# Patient Record
Sex: Female | Born: 1995 | Race: Black or African American | Hispanic: No | Smoking: Former smoker
Health system: Southern US, Community
[De-identification: ages and names within clinical notes are randomized; demographics above are authoritative.]

## PROBLEM LIST (undated history)

## (undated) DIAGNOSIS — O039 Complete or unspecified spontaneous abortion without complication: Secondary | ICD-10-CM

## (undated) DIAGNOSIS — R51 Headache: Secondary | ICD-10-CM

## (undated) HISTORY — PX: NO PAST SURGERIES: SHX2092

## (undated) HISTORY — DX: Headache: R51

---
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
## 2019-11-09 ENCOUNTER — Other Ambulatory Visit: Payer: Self-pay

## 2019-11-09 ENCOUNTER — Ambulatory Visit (INDEPENDENT_AMBULATORY_CARE_PROVIDER_SITE_OTHER): Payer: Medicaid Other | Admitting: Student

## 2019-11-09 VITALS — BP 134/90 | HR 68 | Ht 60.5 in | Wt 105.2 lb

## 2019-11-09 DIAGNOSIS — Z3201 Encounter for pregnancy test, result positive: Secondary | ICD-10-CM | POA: Diagnosis not present

## 2019-11-09 DIAGNOSIS — Z3200 Encounter for pregnancy test, result unknown: Secondary | ICD-10-CM

## 2019-11-09 LAB — POCT PREGNANCY, URINE: Preg Test, Ur: POSITIVE — AB

## 2019-11-09 NOTE — Progress Notes (Signed)
Patient ID: Mariah Sosa, female   DOB: 07-26-1995, 24 y.o.   MRN: [id number]    History:  Mariah Sosa is a 24 y.o. No obstetric history on file. who presents to clinic today with complaint of possible pregnancy.    Past Medical History:  Diagnosis Date  . Headache(784.0)     No past surgical history on file.  The following portions of the patient's history were reviewed and updated as appropriate: allergies, current medications, past family history, past medical history, past social history, past surgical history and problem list.   Review of Systems:  Pertinent items noted in HPI and remainder of comprehensive ROS otherwise negative.  Objective:  Physical Exam BP 134/90   Pulse 68   Ht 5' 0.5" (1.537 m)   Wt 105 lb 3.2 oz (47.7 kg)   SpO2 100%   BMI 20.21 kg/m  Physical Exam Constitutional:      Appearance: Normal appearance.  Musculoskeletal:        General: Normal range of motion.  Skin:    General: Skin is warm.  Neurological:     Mental Status: She is alert.      Labs and Imaging Results for orders placed or performed in visit on 11/09/19 (from the past 24 hour(s))  Pregnancy, urine POC     Status: Abnormal   Collection Time: 11/09/19 10:14 AM  Result Value Ref Range   Preg Test, Ur POSITIVE (A) NEGATIVE    No results found.   Assessment & Plan:  1. Possible pregnancy -Patient doing well, advised patient to schedule NOB intake at 10 weeks.  -Patient and partner had no questions at this time.     Approximately 10 minutes of total time was spent with this patient. Over 50% of encounter was spent on counseling and coordination of care.  Mariah Sosa, CNM 11/09/2019 11:24 AM

## 2019-11-09 NOTE — Progress Notes (Signed)
Pt here today for possible pregnancy. First positive home UPT on 11/06/19. UPT today is positive. Results given. Dating reviewed with pt. Reports LMP of 10/06/19, pt is 4w 6d today; EDD is 07/12/20. Pt denies any pain or bleeding. Reports occasional mild cramping. Good ectopic/miscarriage precations reviewed. Reviewed medications and allergies; list of medications safe to take during pregnancy given. Pt would like to be scheduled for new OB appts. Plans to apply for pregnancy medicaid as soon as she is able. No coverage at this time.   Fleet Contras RN 11/09/19

## 2019-11-15 ENCOUNTER — Encounter: Payer: Self-pay | Admitting: Student

## 2019-12-18 ENCOUNTER — Telehealth: Payer: Self-pay | Admitting: Family Medicine

## 2019-12-18 ENCOUNTER — Encounter (HOSPITAL_COMMUNITY): Payer: Self-pay | Admitting: Obstetrics and Gynecology

## 2019-12-18 ENCOUNTER — Other Ambulatory Visit: Payer: Self-pay

## 2019-12-18 ENCOUNTER — Inpatient Hospital Stay (HOSPITAL_COMMUNITY): Payer: BC Managed Care – PPO

## 2019-12-18 ENCOUNTER — Inpatient Hospital Stay (HOSPITAL_COMMUNITY)
Admission: AD | Admit: 2019-12-18 | Discharge: 2019-12-18 | Disposition: A | Discharge status: Home / Self Care | Payer: BC Managed Care – PPO | Attending: Obstetrics and Gynecology | Admitting: Obstetrics and Gynecology

## 2019-12-18 DIAGNOSIS — O4691 Antepartum hemorrhage, unspecified, first trimester: Secondary | ICD-10-CM

## 2019-12-18 DIAGNOSIS — A5901 Trichomonal vulvovaginitis: Secondary | ICD-10-CM | POA: Diagnosis not present

## 2019-12-18 DIAGNOSIS — O3680X0 Pregnancy with inconclusive fetal viability, not applicable or unspecified: Secondary | ICD-10-CM | POA: Insufficient documentation

## 2019-12-18 DIAGNOSIS — O98311 Other infections with a predominantly sexual mode of transmission complicating pregnancy, first trimester: Secondary | ICD-10-CM | POA: Diagnosis not present

## 2019-12-18 DIAGNOSIS — Z6790 Unspecified blood type, Rh positive: Secondary | ICD-10-CM | POA: Insufficient documentation

## 2019-12-18 DIAGNOSIS — Z3A09 9 weeks gestation of pregnancy: Secondary | ICD-10-CM | POA: Insufficient documentation

## 2019-12-18 DIAGNOSIS — O209 Hemorrhage in early pregnancy, unspecified: Secondary | ICD-10-CM | POA: Diagnosis present

## 2019-12-18 DIAGNOSIS — A599 Trichomoniasis, unspecified: Secondary | ICD-10-CM

## 2019-12-18 DIAGNOSIS — O4690 Antepartum hemorrhage, unspecified, unspecified trimester: Secondary | ICD-10-CM

## 2019-12-18 HISTORY — DX: Trichomoniasis, unspecified: A59.9

## 2019-12-18 LAB — URINALYSIS, ROUTINE W REFLEX MICROSCOPIC

## 2019-12-18 LAB — WET PREP, GENITAL
Clue Cells Wet Prep HPF POC: NONE SEEN
Sperm: NONE SEEN
Yeast Wet Prep HPF POC: NONE SEEN

## 2019-12-18 LAB — ABO/RH: ABO/RH(D): A POS

## 2019-12-18 LAB — CBC
HCT: 38.8 % (ref 36.0–46.0)
Hemoglobin: 13.4 g/dL (ref 12.0–15.0)
MCH: 32.2 pg (ref 26.0–34.0)
MCHC: 34.5 g/dL (ref 30.0–36.0)
MCV: 93.3 fL (ref 80.0–100.0)
Platelets: 226 10*3/uL (ref 150–400)
RBC: 4.16 MIL/uL (ref 3.87–5.11)
RDW: 11.8 % (ref 11.5–15.5)
WBC: 9.1 10*3/uL (ref 4.0–10.5)
nRBC: 0 % (ref 0.0–0.2)

## 2019-12-18 LAB — URINALYSIS, MICROSCOPIC (REFLEX): RBC / HPF: >50 RBC/hpf (ref 0–5)

## 2019-12-18 LAB — HCG, QUANTITATIVE, PREGNANCY: hCG, Beta Chain, Quant, S: 39068 m[IU]/mL — ABNORMAL HIGH (ref <5)

## 2019-12-18 MED ORDER — METRONIDAZOLE 500 MG PO TABS
2000.0000 mg | ORAL_TABLET | Freq: Once | ORAL | Status: AC
  Administered 2019-12-18: 2000 mg via ORAL
  Filled 2019-12-18: qty 4

## 2019-12-18 NOTE — MAU Note (Signed)
Patient reports to MAU with vaginal bleeding that started on 12/15/19 and has continued since. Patient states she soaked 2 pads last night with some clots.  Reports cramping that feels like a period.

## 2019-12-18 NOTE — MAU Provider Note (Signed)
History     CSN: [id number]  Arrival date and time: 12/18/19 1007   First Provider Initiated Contact with Patient 12/18/19 1109      Chief Complaint  Patient presents with   Vaginal Bleeding   24 y.o. G1 @[redacted]w[redacted]d  by LMP presenting with VB. Reports onset 3 days ago. Reports passing plum sized clots yesterday. She used 3 pads during the night. No recent IC. Endorses intermittent LAP. Rates 7/10.    OB History    Gravida  1   Para      Term      Preterm      AB      Living        SAB      TAB      Ectopic      Multiple      Live Births              Past Medical History:  Diagnosis Date   Headache(784.0)    Trichimoniasis 12/18/2019    Past Surgical History:  Procedure Laterality Date   NO PAST SURGERIES      Family History  Problem Relation Age of Onset   Heart failure Paternal Grandmother     Social History   Tobacco Use   Smoking status: Never Smoker   Smokeless tobacco: Never Used  Substance Use Topics   Alcohol use: No   Drug use: No    Allergies: No Known Allergies  No medications prior to admission.    Review of Systems  Constitutional: Negative for chills and fever.  Gastrointestinal: Positive for abdominal pain.  Genitourinary: Positive for vaginal bleeding.   Physical Exam   Blood pressure 132/88, pulse 91, temperature 99.3 F (37.4 C), temperature source Oral, resp. rate 16, weight 50.7 kg, last menstrual period 10/13/2019, SpO2 100 %.  Physical Exam Vitals and nursing note reviewed. Exam conducted with a chaperone present.  Constitutional:      General: She is not in acute distress.    Appearance: Normal appearance.  HENT:     Head: Normocephalic and atraumatic.  Cardiovascular:     Rate and Rhythm: Normal rate.  Pulmonary:     Effort: Pulmonary effort is normal. No respiratory distress.  Abdominal:     General: There is no distension.     Palpations: Abdomen is soft. There is no mass.     Tenderness:  There is no abdominal tenderness. There is no guarding or rebound.  Genitourinary:    Comments: External: no lesions or erythema Vagina: rugated, pink, moist, scant bloody discharge Uterus: non enlarged, anteverted, non tender, no CMT Adnexae: no masses, no tenderness left, no tenderness right Cervix closed  Musculoskeletal:        General: Normal range of motion.     Cervical back: Normal range of motion.  Skin:    General: Skin is warm and dry.  Neurological:     General: No focal deficit present.     Mental Status: She is alert and oriented to person, place, and time.  Psychiatric:        Mood and Affect: Mood normal.        Behavior: Behavior normal.    Results for orders placed or performed during the hospital encounter of 12/18/19 (from the past 24 hour(s))  ABO/Rh     Status: None   Collection Time: 12/18/19 10:31 AM  Result Value Ref Range   ABO/RH(D) A POS    No rh immune globuloin  NOT A RH IMMUNE GLOBULIN CANDIDATE, PT RH POSITIVE Performed at [facility], [address], [place], [place] [address]   CBC     Status: None   Collection Time: 12/18/19 10:31 AM  Result Value Ref Range   WBC 9.1 4.0 - 10.5 K/uL   RBC 4.16 3.87 - 5.11 MIL/uL   Hemoglobin 13.4 12.0 - 15.0 g/dL   HCT 46.6 36 - 46 %   MCV 93.3 80.0 - 100.0 fL   MCH 32.2 26.0 - 34.0 pg   MCHC 34.5 30.0 - 36.0 g/dL   RDW 59.9 35.7 - 01.7 %   Platelets 226 150 - 400 K/uL   nRBC 0.0 0.0 - 0.2 %  hCG, quantitative, pregnancy     Status: Abnormal   Collection Time: 12/18/19 10:31 AM  Result Value Ref Range   hCG, Beta Chain, Quant, S 39,068 (H) <5 mIU/mL  Urinalysis, Routine w reflex microscopic Urine, Clean Catch     Status: Abnormal   Collection Time: 12/18/19 10:49 AM  Result Value Ref Range   Color, Urine RED (A) YELLOW   APPearance TURBID (A) CLEAR   Specific Gravity, Urine  1.005 - 1.030    TEST NOT REPORTED DUE TO COLOR INTERFERENCE OF URINE PIGMENT   pH  5.0 - 8.0    TEST NOT  REPORTED DUE TO COLOR INTERFERENCE OF URINE PIGMENT   Glucose, UA (A) NEGATIVE mg/dL    TEST NOT REPORTED DUE TO COLOR INTERFERENCE OF URINE PIGMENT   Hgb urine dipstick (A) NEGATIVE    TEST NOT REPORTED DUE TO COLOR INTERFERENCE OF URINE PIGMENT   Bilirubin Urine (A) NEGATIVE    TEST NOT REPORTED DUE TO COLOR INTERFERENCE OF URINE PIGMENT   Ketones, ur (A) NEGATIVE mg/dL    TEST NOT REPORTED DUE TO COLOR INTERFERENCE OF URINE PIGMENT   Protein, ur (A) NEGATIVE mg/dL    TEST NOT REPORTED DUE TO COLOR INTERFERENCE OF URINE PIGMENT   Nitrite (A) NEGATIVE    TEST NOT REPORTED DUE TO COLOR INTERFERENCE OF URINE PIGMENT   Leukocytes,Ua (A) NEGATIVE    TEST NOT REPORTED DUE TO COLOR INTERFERENCE OF URINE PIGMENT  Urinalysis, Microscopic (reflex)     Status: Abnormal   Collection Time: 12/18/19 10:49 AM  Result Value Ref Range   RBC / HPF >50 0 - 5 RBC/hpf   WBC, UA 6-10 0 - 5 WBC/hpf   Bacteria, UA RARE (A) NONE SEEN   Squamous Epithelial / LPF 0-5 0 - 5   Urine-Other LESS THAN 10 mL OF URINE SUBMITTED   Wet prep, genital     Status: Abnormal   Collection Time: 12/18/19 11:18 AM   Specimen: Cervix  Result Value Ref Range   Yeast Wet Prep HPF POC NONE SEEN NONE SEEN   Trich, Wet Prep PRESENT (A) NONE SEEN   Clue Cells Wet Prep HPF POC NONE SEEN NONE SEEN   WBC, Wet Prep HPF POC MANY (A) NONE SEEN   Sperm NONE SEEN    US OB LESS THAN 14 WEEKS WITH OB TRANSVAGINAL  Result Date: 12/18/2019 CLINICAL DATA:  Vaginal bleeding for the last several days. EXAM: OBSTETRIC <14 WK [place] AND TRANSVAGINAL OB US TECHNIQUE: Both transabdominal and transvaginal ultrasound examinations were performed for complete evaluation of the gestation as well as the maternal uterus, adnexal regions, and pelvic cul-de-sac. Transvaginal technique was performed to assess early pregnancy. COMPARISON:  None. FINDINGS: Intrauterine gestational sac: Not visualized. Yolk sac:  None  Embryo:  None Cardiac Activity: None Maternal  uterus/adnexae: Uterus appears normal. Endometrium shows a thickness of 28 mm with a somewhat heterogeneous nature. No endometrial canal fluid. Both ovaries are normal. No sign of adnexal mass. No free fluid. IMPRESSION: No sign of intrauterine pregnancy. No sign of ectopic pregnancy. Thickened somewhat heterogeneous endometrium at 2.8 cm, possibly physiologic. Electronically Signed   By: Paulina Fusi M.D.   On: 12/18/2019 12:08   MAU Course  Procedures  MDM Labs and [place] ordered and reviewed. +trich, pt and partner notified. Pt treated today and partner given Rx. IUGS but no YS or FP seen on [place], findings could indicate early pregnancy, ectopic pregnancy, or failed pregnancy, discussed with pt. Will follow quant in 48 hrs. Stable for discharge home.   Assessment and Plan   1. Pregnancy of unknown anatomic location   2. Vaginal bleeding in pregnancy   3. Blood type, Rh positive   4. Trichomonal vaginitis    Discharge home Follow up in MAU (due to holiday) on 12/20/19 for qhcg SAB/ectopic precautions  Allergies as of 12/18/2019   No Known Allergies     Medication List    You have not been prescribed any medications.    Donette Larry, CNM 12/18/2019, 1:47 PM

## 2019-12-18 NOTE — MAU Note (Signed)
No urine culture obtained due to small quantity and blood

## 2019-12-18 NOTE — Telephone Encounter (Signed)
Been bleeding since Friday and since Saturday she been having Heavy Bleeding, last night she was having a lot of cramping and blood clots.

## 2019-12-18 NOTE — Telephone Encounter (Signed)
Spoke with patient about her [employer] Medicaid needing to be switched to one that's in [facility] network. Patient stated she she understood.

## 2019-12-18 NOTE — Discharge Instructions (Signed)
Trichomoniasis Trichomoniasis is an STI (sexually transmitted infection) that can affect both women and men. In women, the outer area of the female genitalia (vulva) and the vagina are affected. In men, mainly the penis is affected, but the prostate and other reproductive organs can also be involved.  This condition can be treated with medicine. It often has no symptoms (is asymptomatic), especially in men. If not treated, trichomoniasis can last for months or years. What are the causes? This condition is caused by a parasite called Trichomonas vaginalis. Trichomoniasis most often spreads from person to person (is contagious) through sexual contact. What increases the risk? The following factors may make you more likely to develop this condition:  Having unprotected sex.  Having sex with a partner who has trichomoniasis.  Having multiple sexual partners.  Having had previous trichomoniasis infections or other STIs. What are the signs or symptoms? In women, symptoms of trichomoniasis include:  Abnormal vaginal discharge that is clear, white, gray, or yellow-green and foamy and has an unusual "fishy" odor.  Itching and irritation of the vagina and vulva.  Burning or pain during urination or sex.  Redness and swelling of the genitals. In men, symptoms of trichomoniasis include:  Penile discharge that may be foamy or contain pus.  Pain in the penis. This may happen only when urinating.  Itching or irritation inside the penis.  Burning after urination or ejaculation. How is this diagnosed? In women, this condition may be found during a routine Pap test or physical exam. It may be found in men during a routine physical exam. Your health care provider may do tests to help diagnose this infection, such as:  Urine tests (men and women).  The following in women: ? Testing the pH of the vagina. ? A vaginal swab test that checks for the Trichomonas vaginalis parasite. ? Testing vaginal  secretions. Your health care provider may test you for other STIs, including HIV (human immunodeficiency virus). How is this treated? This condition is treated with medicine taken by mouth (orally), such as metronidazole or tinidazole, to fight the infection. Your sexual partner(s) also need to be tested and treated.  If you are a woman and you plan to become pregnant or think you may be pregnant, tell your health care provider right away. Some medicines that are used to treat the infection should not be taken during pregnancy. Your health care provider may recommend over-the-counter medicines or creams to help relieve itching or irritation. You may be tested for infection again 3 months after treatment. Follow these instructions at home:  Take and use over-the-counter and prescription medicines, including creams, only as told by your health care provider.  Take your antibiotic medicine as told by your health care provider. Do not stop taking the antibiotic even if you start to feel better.  Do not have sex until 7-10 days after you finish your medicine, or until your health care provider approves. Ask your health care provider when you may start to have sex again.  (Women) Do not douche or wear tampons while you have the infection.  Discuss your infection with your sexual partner(s). Make sure that your partner gets tested and treated, if necessary.  Keep all follow-up visits as told by your health care provider. This is important. How is this prevented?   Use condoms every time you have sex. Using condoms correctly and consistently can help protect against STIs.  Avoid having multiple sexual partners.  Talk with your sexual partner about any   symptoms that either of you may have, as well as any history of STIs.  Get tested for STIs and STDs (sexually transmitted diseases) before you have sex. Ask your partner to do the same.  Do not have sexual contact if you have symptoms of  trichomoniasis or another STI. Contact a health care provider if:  You still have symptoms after you finish your medicine.  You develop pain in your abdomen.  You have pain when you urinate.  You have bleeding after sex.  You develop a rash.  You feel nauseous or you vomit.  You plan to become pregnant or think you may be pregnant. Summary  Trichomoniasis is an STI (sexually transmitted infection) that can affect both women and men.  This condition often has no symptoms (is asymptomatic), especially in men.  Without treatment, this condition can last for months or years.  You should not have sex until 7-10 days after you finish your medicine, or until your health care provider approves. Ask your health care provider when you may start to have sex again.  Discuss your infection with your sexual partner(s). Make sure that your partner gets tested and treated, if necessary. This information is not intended to replace advice given to you by your health care provider. Make sure you discuss any questions you have with your health care provider. Document Revised: 10/26/2017 Document Reviewed: 10/26/2017 Elsevier Patient Education  2020 [employer]   Vaginal Bleeding During Pregnancy, First Trimester  A small amount of bleeding (spotting) from the vagina is common during early pregnancy. Sometimes the bleeding is normal and does not cause problems. At other times, though, bleeding may be a sign of something serious. Tell your doctor about any bleeding from your vagina right away. Follow these instructions at home: Activity  Follow your doctor's instructions about how active you can be.  If needed, make plans for someone to help with your normal activities.  Do not have sex or orgasms until your doctor says that this is safe. General instructions  Take over-the-counter and prescription medicines only as told by your doctor.  Watch your condition for any changes.  Write  down: ? The number of pads you use each day. ? How often you change pads. ? How soaked (saturated) your pads are.  Do not use tampons.  Do not douche.  If you pass any tissue from your vagina, save it to show to your doctor.  Keep all follow-up visits as told by your doctor. This is important. Contact a doctor if:  You have vaginal bleeding at any time while you are pregnant.  You have cramps.  You have a fever. Get help right away if:  You have very bad cramps in your back or belly (abdomen).  You pass large clots or a lot of tissue from your vagina.  Your bleeding gets worse.  You feel light-headed.  You feel weak.  You pass out (faint).  You have chills.  You are leaking fluid from your vagina.  You have a gush of fluid from your vagina. Summary  Sometimes vaginal bleeding during pregnancy is normal and does not cause problems. At other times, bleeding may be a sign of something serious.  Tell your doctor about any bleeding from your vagina right away.  Follow your doctor's instructions about how active you can be. You may need someone to help you with your normal activities. This information is not intended to replace advice given to you by your health care  provider. Make sure you discuss any questions you have with your health care provider. Document Revised: 05/03/2018 Document Reviewed: 04/15/2016 Elsevier Patient Education  2020 Else[employer] Inc.  

## 2019-12-18 NOTE — Telephone Encounter (Signed)
Call returned to pt and received her voicemail.  I left a message stating that I am returning her call to discuss her concerns. I requested that she please leave a detailed message with updated information and we will call back tomorrow.

## 2019-12-19 LAB — GC/CHLAMYDIA PROBE AMP (~~LOC~~) NOT AT ARMC
Chlamydia: NEGATIVE
Comment: NEGATIVE
Comment: NORMAL
Neisseria Gonorrhea: NEGATIVE

## 2019-12-19 NOTE — Telephone Encounter (Signed)
Attempted to call patient and she did not answer. Patient was seen in [facility]U yesterday to address bleeding. LM for patient to call the office back with further questions or concerns.

## 2019-12-20 ENCOUNTER — Other Ambulatory Visit: Payer: Self-pay

## 2019-12-20 ENCOUNTER — Encounter: Payer: Self-pay | Admitting: Medical

## 2019-12-20 ENCOUNTER — Inpatient Hospital Stay (HOSPITAL_COMMUNITY)
Admission: AD | Admit: 2019-12-20 | Discharge: 2019-12-20 | Disposition: A | Discharge status: Home / Self Care | Payer: BC Managed Care – PPO | Attending: Obstetrics and Gynecology | Admitting: Obstetrics and Gynecology

## 2019-12-20 DIAGNOSIS — Z3A09 9 weeks gestation of pregnancy: Secondary | ICD-10-CM | POA: Diagnosis not present

## 2019-12-20 DIAGNOSIS — Z6710 Type A blood, Rh positive: Secondary | ICD-10-CM

## 2019-12-20 DIAGNOSIS — O3680X0 Pregnancy with inconclusive fetal viability, not applicable or unspecified: Secondary | ICD-10-CM | POA: Diagnosis not present

## 2019-12-20 DIAGNOSIS — O0281 Inappropriate change in quantitative human chorionic gonadotropin (hCG) in early pregnancy: Secondary | ICD-10-CM

## 2019-12-20 LAB — HCG, QUANTITATIVE, PREGNANCY: hCG, Beta Chain, Quant, S: 32527 m[IU]/mL — ABNORMAL HIGH (ref <5)

## 2019-12-20 NOTE — MAU Note (Signed)
Patient here for follow up hcg levels, denies any other complaints.

## 2019-12-20 NOTE — MAU Provider Note (Signed)
Subjective:  Mariah Sosa is a 24 y.o. G1P0 at [redacted]w[redacted]d who presents today for FU BHCG. She was seen on 12/18/19. Results from that day show no IUP on [place], and HCG 39,068. She denies vaginal bleeding. She denies abdominal or pelvic pain.  Objective:  Physical Exam  Nursing note and vitals reviewed. Constitutional: She is oriented to person, place, and time. She appears well-developed and well-nourished. No distress.  HENT:  Head: Normocephalic.  Cardiovascular: Normal rate.  Respiratory: Effort normal.  GI: Soft. There is no tenderness.  Neurological: She is alert and oriented to person, place, and time. Skin: Skin is warm and dry.  Psychiatric: She has a normal mood and affect.   Results for orders placed or performed during the hospital encounter of 12/20/19 (from the past 24 hour(s))  hCG, quantitative, pregnancy     Status: Abnormal   Collection Time: 12/20/19 10:52 AM  Result Value Ref Range   hCG, Beta Chain, Quant, S 32,527 (H) <5 mIU/mL    Assessment/Plan: Pregnancy of unknown location Decrease in Hcg  HCG did not rise appropriately FU in 2 days for: serial quants Strict ectopic precautions A positive blood type   Venia Carbon I, NP 12/20/2019 12:29 PM

## 2019-12-22 ENCOUNTER — Inpatient Hospital Stay (HOSPITAL_COMMUNITY): Payer: BC Managed Care – PPO

## 2019-12-22 ENCOUNTER — Inpatient Hospital Stay (HOSPITAL_COMMUNITY)
Admission: AD | Admit: 2019-12-22 | Discharge: 2019-12-22 | Disposition: A | Discharge status: Home / Self Care | Payer: BC Managed Care – PPO | Attending: Family Medicine | Admitting: Family Medicine

## 2019-12-22 DIAGNOSIS — Z3A10 10 weeks gestation of pregnancy: Secondary | ICD-10-CM | POA: Diagnosis not present

## 2019-12-22 DIAGNOSIS — Z8619 Personal history of other infectious and parasitic diseases: Secondary | ICD-10-CM | POA: Insufficient documentation

## 2019-12-22 DIAGNOSIS — Z6790 Unspecified blood type, Rh positive: Secondary | ICD-10-CM | POA: Insufficient documentation

## 2019-12-22 DIAGNOSIS — O3680X0 Pregnancy with inconclusive fetal viability, not applicable or unspecified: Secondary | ICD-10-CM | POA: Diagnosis not present

## 2019-12-22 DIAGNOSIS — Z3A11 11 weeks gestation of pregnancy: Secondary | ICD-10-CM | POA: Insufficient documentation

## 2019-12-22 DIAGNOSIS — O26891 Other specified pregnancy related conditions, first trimester: Secondary | ICD-10-CM | POA: Diagnosis present

## 2019-12-22 LAB — HCG, QUANTITATIVE, PREGNANCY: hCG, Beta Chain, Quant, S: 30696 m[IU]/mL — ABNORMAL HIGH (ref <5)

## 2019-12-22 NOTE — MAU Note (Signed)
Pt here for F/U BHCG. Repots vag bleeding is not as heavy as it was at the begging of the week Not passing any clots or tissue. Still have mild to moderate abd cramping.

## 2019-12-22 NOTE — Discharge Instructions (Signed)
Dilation and Curettage or Vacuum Curettage  Dilation and curettage (D&C) and vacuum curettage are minor procedures. A D&C involves stretching (dilation) the cervix and scraping (curettage) the inside lining of the uterus (endometrium). During a D&C, tissue is gently scraped from the endometrium, starting from the top portion of the uterus down to the lowest part of the uterus (cervix). During a vacuum curettage, the lining and tissue in the uterus are removed with the use of gentle suction. Curettage may be performed to either diagnose or treat a problem. As a diagnostic procedure, curettage is performed to examine tissues from the uterus. A diagnostic curettage may be done if you have:  Irregular bleeding in the uterus.  Bleeding with the development of clots.  Spotting between menstrual periods.  Prolonged menstrual periods or other abnormal bleeding.  Bleeding after menopause.  No menstrual period (amenorrhea).  A change in size and shape of the uterus.  Abnormal endometrial cells discovered during a Pap test. As a treatment procedure, curettage may be performed for the following reasons:  Removal of an IUD (intrauterine device).  Removal of retained placenta after giving birth.  Abortion.  Miscarriage.  Removal of endometrial polyps.  Removal of uncommon types of noncancerous lumps (fibroids). Tell a health care provider about:  Any allergies you have, including allergies to prescribed medicine or latex.  All medicines you are taking, including vitamins, herbs, eye drops, creams, and over-the-counter medicines. This is especially important if you take any blood-thinning medicine. Bring a list of all of your medicines to your appointment.  Any problems you or family members have had with anesthetic medicines.  Any blood disorders you have.  Any surgeries you have had.  Your medical history and any medical conditions you have.  Whether you are pregnant or may be  pregnant.  Recent vaginal infections you have had.  Recent menstrual periods, bleeding problems you have had, and what form of birth control (contraception) you use. What are the risks? Generally, this is a safe procedure. However, problems may occur, including:  Infection.  Heavy vaginal bleeding.  Allergic reactions to medicines.  Damage to the cervix or other structures or organs.  Development of scar tissue (adhesions) inside the uterus, which can cause abnormal amounts of menstrual bleeding. This may make it harder to get pregnant in the future.  A hole (perforation) or puncture in the uterine wall. This is rare. What happens before the procedure? Staying hydrated Follow instructions from your health care provider about hydration, which may include:  Up to 2 hours before the procedure - you may continue to drink clear liquids, such as water, clear fruit juice, black coffee, and plain tea. Eating and drinking restrictions Follow instructions from your health care provider about eating and drinking, which may include:  8 hours before the procedure - stop eating heavy meals or foods such as meat, fried foods, or fatty foods.  6 hours before the procedure - stop eating light meals or foods, such as toast or cereal.  6 hours before the procedure - stop drinking milk or drinks that contain milk.  2 hours before the procedure - stop drinking clear liquids. If your health care provider told you to take your medicine(s) on the day of your procedure, take them with only a sip of water. Medicines  Ask your health care provider about: ? Changing or stopping your regular medicines. This is especially important if you are taking diabetes medicines or blood thinners. ? Taking medicines such as aspirin   and ibuprofen. These medicines can thin your blood. Do not take these medicines before your procedure if your health care provider instructs you not to.  You may be given antibiotic  medicine to help prevent infection. General instructions  For 24 hours before your procedure, do not: ? Douche. ? Use tampons. ? Use medicines, creams, or suppositories in the vagina. ? Have sexual intercourse.  You may be given a pregnancy test on the day of the procedure.  Plan to have someone take you home from the hospital or clinic.  You may have a blood or urine sample taken.  If you will be going home right after the procedure, plan to have someone with you for 24 hours. What happens during the procedure?  To reduce your risk of infection: ? Your health care team will wash or sanitize their hands. ? Your skin will be washed with soap.  An IV tube will be inserted into one of your veins.  You will be given one of the following: ? A medicine that numbs the area in and around the cervix (local anesthetic). ? A medicine to make you fall asleep (general anesthetic).  You will lie down on your back, with your feet in foot rests (stirrups).  The size and position of your uterus will be checked.  A lubricated instrument (speculum or Sims retractor) will be inserted into the back side of your vagina. The speculum will be used to hold apart the walls of your vagina so your health care provider can see your cervix.  A tool (tenaculum) will be attached to the lip of the cervix to stabilize it.  Your cervix will be softened and dilated. This may be done by: ? Taking a medicine. ? Having tapered dilators or thin rods (laminaria) or gradual widening instruments (tapered dilators) inserted into your cervix.  A small, sharp, curved instrument (curette) will be used to scrape a small amount of tissue or cells from the endometrium or cervical canal. In some cases, gentle suction is applied with the curette. The curette will then be removed. The cells will be taken to a lab for testing. The procedure may vary among health care providers and hospitals. What happens after the  procedure?  You may have mild cramping, backache, pain, and light bleeding or spotting. You may pass small blood clots from your vagina.  You may have to wear compression stockings. These stockings help to prevent blood clots and reduce swelling in your legs.  Your blood pressure, heart rate, breathing rate, and blood oxygen level will be monitored until the medicines you were given have worn off. Summary  Dilation and curettage (D&C) involves stretching (dilation) the cervix and scraping (curettage) the inside lining of the uterus (endometrium).  After the procedure, you may have mild cramping, backache, pain, and light bleeding or spotting. You may pass small blood clots from your vagina.  Plan to have someone take you home from the hospital or clinic. This information is not intended to replace advice given to you by your health care provider. Make sure you discuss any questions you have with your health care provider. Document Revised: 12/25/2016 Document Reviewed: 09/29/2015 Elsevier Patient Education  2020 Elsevier Inc.  

## 2019-12-22 NOTE — MAU Provider Note (Signed)
Mariah Sosa  is a 24 y.o. G1P0 at [redacted]w[redacted]d who presents to MAU today for follow-up quant hCG after 48 hours. The patient was seen in MAU on 12/18/19 and had quant hCG of 39,068 and US showed no IUP. She returned on 12/20/19 and qhcg was 32,527. She reports mild low abdominal cramping and small amt of bleeding.   OB History  Gravida Para Term Preterm AB Living  1            SAB TAB Ectopic Multiple Live Births               # Outcome Date GA Lbr Len/2nd Weight Sex Delivery Anes PTL Lv  1 Current             Past Medical History:  Diagnosis Date  . Headache(784.0)   . Trichimoniasis 12/18/2019    ROS: + VB + mild abd cramping  BP 118/74   Pulse 94   Temp 98.8 F (37.1 C)   Resp 18   LMP 10/13/2019 (Approximate)   CONSTITUTIONAL: Well-developed, well-nourished female in no acute distress.  MUSCULOSKELETAL: Normal range of motion.  CARDIOVASCULAR: Regular heart rate RESPIRATORY: Normal effort NEUROLOGICAL: Alert and oriented to person, place, and time.  SKIN: Not diaphoretic. No erythema. No pallor. PSYCH: Normal mood and affect. Normal behavior. Normal judgment and thought content.  Results for orders placed or performed during the hospital encounter of 12/22/19 (from the past 24 hour(s))  hCG, quantitative, pregnancy     Status: Abnormal   Collection Time: 12/22/19 10:18 AM  Result Value Ref Range   hCG, Beta Chain, Quant, S 30,696 (H) <5 mIU/mL   US OB Transvaginal  Result Date: 12/22/2019 CLINICAL DATA:  24 year old female with vaginal bleeding in the 1st trimester of pregnancy, no IUP identified on ultrasound 4 days ago. Quantitative beta HCG has reportedly decreased over the past 2 days, now 30,696. And 0 days estimated gestational age by LMP 10 weeks. EXAM: TRANSVAGINAL OB ULTRASOUND TECHNIQUE: Transvaginal ultrasound was performed for complete evaluation of the gestation as well as the maternal uterus, adnexal regions, and pelvic cul-de-sac. COMPARISON:   12/18/2019. FINDINGS: Intrauterine gestational sac: None. Maternal uterus/adnexae: No pelvic free fluid. Thickened and heterogeneous appearing endometrium (images 45 through 47) with areas of alternating hyper- and hypoechogenicity. Associated heterogeneous vascularity (images 63 and 67. Endometrium remains thickened up to approximately 22 mm. Right ovary remains within normal limits (image 19), 1.6 x 3.5 x 1.6 cm. Possible corpus luteum cyst visible on image 20. Left ovary remains within normal limits (image 38), 1.1 x 2.5 x 1.2 cm. IMPRESSION: 1. No IUP or ectopic pregnancy identified.  No pelvic free fluid. 2. Thickened and very heterogeneous endometrium. Consider the possibility of gestational trophoblastic disease. Electronically Signed   By: Odessa Fleming M.D.   On: 12/22/2019 13:53   MDM: Consult with Dr. Shawnie Pons, will obtain US. Consult with Dr. Shawnie Pons, images reviewed. Concern for GTD, plan for D&C. Dr. Shawnie Pons in to discuss with pt and partner.   A: 1. Pregnancy, location unknown   2. Blood type, Rh positive    P: Discharge home Bleeding/ectopic precautions discussed Follow up for outpatient surgery next week Patient may return to MAU as needed or if her condition were to change or worsen   Donette Larry, [place] 12/22/2019 2:45 PM

## 2019-12-24 ENCOUNTER — Ambulatory Visit (HOSPITAL_COMMUNITY)
Admission: AD | Admit: 2019-12-24 | Discharge: 2019-12-25 | Disposition: A | Discharge status: Home / Self Care | Payer: BC Managed Care – PPO | Attending: Family Medicine | Admitting: Family Medicine

## 2019-12-24 ENCOUNTER — Other Ambulatory Visit: Payer: Self-pay

## 2019-12-24 ENCOUNTER — Inpatient Hospital Stay (HOSPITAL_COMMUNITY): Payer: BC Managed Care – PPO

## 2019-12-24 ENCOUNTER — Inpatient Hospital Stay (HOSPITAL_COMMUNITY): Payer: BC Managed Care – PPO | Admitting: Anesthesiology

## 2019-12-24 ENCOUNTER — Encounter (HOSPITAL_COMMUNITY)
Admission: AD | Disposition: A | Discharge status: Home / Self Care | Payer: Self-pay | Source: Home / Self Care | Attending: Obstetrics & Gynecology

## 2019-12-24 ENCOUNTER — Encounter (HOSPITAL_COMMUNITY): Payer: Self-pay | Admitting: Obstetrics & Gynecology

## 2019-12-24 DIAGNOSIS — O034 Incomplete spontaneous abortion without complication: Secondary | ICD-10-CM | POA: Diagnosis not present

## 2019-12-24 DIAGNOSIS — O020 Blighted ovum and nonhydatidiform mole: Secondary | ICD-10-CM | POA: Diagnosis present

## 2019-12-24 DIAGNOSIS — Z20822 Contact with and (suspected) exposure to covid-19: Secondary | ICD-10-CM | POA: Insufficient documentation

## 2019-12-24 DIAGNOSIS — O209 Hemorrhage in early pregnancy, unspecified: Secondary | ICD-10-CM | POA: Diagnosis present

## 2019-12-24 DIAGNOSIS — N939 Abnormal uterine and vaginal bleeding, unspecified: Secondary | ICD-10-CM

## 2019-12-24 HISTORY — PX: DILATION AND EVACUATION: SHX1459

## 2019-12-24 HISTORY — DX: Blighted ovum and nonhydatidiform mole: O02.0

## 2019-12-24 LAB — COMPREHENSIVE METABOLIC PANEL
ALT: 13 U/L (ref 0–44)
AST: 17 U/L (ref 15–41)
Albumin: 3.9 g/dL (ref 3.5–5.0)
Alkaline Phosphatase: 26 U/L — ABNORMAL LOW (ref 38–126)
Anion gap: 11 (ref 5–15)
BUN: 9 mg/dL (ref 6–20)
CO2: 23 mmol/L (ref 22–32)
Calcium: 9.5 mg/dL (ref 8.9–10.3)
Chloride: 105 mmol/L (ref 98–111)
Creatinine, Ser: 0.72 mg/dL (ref 0.44–1.00)
GFR, Estimated: >60 mL/min (ref >60)
Glucose, Bld: 92 mg/dL (ref 70–99)
Potassium: 3.2 mmol/L — ABNORMAL LOW (ref 3.5–5.1)
Sodium: 139 mmol/L (ref 135–145)
Total Bilirubin: 0.5 mg/dL (ref 0.3–1.2)
Total Protein: 6.8 g/dL (ref 6.5–8.1)

## 2019-12-24 LAB — RESP PANEL BY RT-PCR (FLU A&B, COVID) ARPGX2
Influenza A by PCR: NEGATIVE
Influenza B by PCR: NEGATIVE
SARS Coronavirus 2 by RT PCR: NEGATIVE

## 2019-12-24 LAB — TYPE AND SCREEN
ABO/RH(D): A POS
Antibody Screen: NEGATIVE

## 2019-12-24 LAB — CBC
HCT: 32 % — ABNORMAL LOW (ref 36.0–46.0)
Hemoglobin: 10.8 g/dL — ABNORMAL LOW (ref 12.0–15.0)
MCH: 32.2 pg (ref 26.0–34.0)
MCHC: 33.8 g/dL (ref 30.0–36.0)
MCV: 95.5 fL (ref 80.0–100.0)
Platelets: 220 10*3/uL (ref 150–400)
RBC: 3.35 MIL/uL — ABNORMAL LOW (ref 3.87–5.11)
RDW: 12.1 % (ref 11.5–15.5)
WBC: 10.1 10*3/uL (ref 4.0–10.5)
nRBC: 0 % (ref 0.0–0.2)

## 2019-12-24 SURGERY — DILATION AND EVACUATION, UTERUS
Anesthesia: General

## 2019-12-24 MED ORDER — SUCCINYLCHOLINE CHLORIDE 20 MG/ML IJ SOLN
INTRAMUSCULAR | Status: DC | PRN
  Administered 2019-12-24: 120 mg via INTRAVENOUS

## 2019-12-24 MED ORDER — IBUPROFEN 600 MG PO TABS: 600.0000 mg | ORAL_TABLET | Freq: Four times a day (QID) | ORAL | 1 refills | Status: DC | PRN

## 2019-12-24 MED ORDER — PROPOFOL 10 MG/ML IV BOLUS
INTRAVENOUS | Status: DC | PRN
  Administered 2019-12-24: 150 mg via INTRAVENOUS

## 2019-12-24 MED ORDER — ONDANSETRON HCL 4 MG/2ML IJ SOLN
INTRAMUSCULAR | Status: DC | PRN
  Administered 2019-12-24: 4 mg via INTRAVENOUS

## 2019-12-24 MED ORDER — SODIUM CHLORIDE 0.9 % IV SOLN
100.0000 mg | INTRAVENOUS | Status: AC
  Administered 2019-12-24: 100 mg via INTRAVENOUS
  Filled 2019-12-24: qty 100

## 2019-12-24 MED ORDER — LIDOCAINE-EPINEPHRINE 1 %-1:100000 IJ SOLN
INTRAMUSCULAR | Status: AC
  Filled 2019-12-24: qty 1

## 2019-12-24 MED ORDER — PROMETHAZINE HCL 25 MG/ML IJ SOLN: 6.2500 mg | INTRAMUSCULAR | Status: DC | PRN

## 2019-12-24 MED ORDER — OXYCODONE HCL 5 MG/5ML PO SOLN: 5.0000 mg | Freq: Once | ORAL | Status: DC | PRN

## 2019-12-24 MED ORDER — HYDROMORPHONE HCL 1 MG/ML IJ SOLN
1.0000 mg | Freq: Once | INTRAMUSCULAR | Status: AC
  Administered 2019-12-24: 1 mg via INTRAVENOUS

## 2019-12-24 MED ORDER — LACTATED RINGERS IV BOLUS
1000.0000 mL | Freq: Once | INTRAVENOUS | Status: AC
  Administered 2019-12-24: 1000 mL via INTRAVENOUS

## 2019-12-24 MED ORDER — MIDAZOLAM HCL 2 MG/2ML IJ SOLN
INTRAMUSCULAR | Status: DC | PRN
  Administered 2019-12-24: 1 mg via INTRAVENOUS

## 2019-12-24 MED ORDER — PROPOFOL 10 MG/ML IV BOLUS
INTRAVENOUS | Status: AC
  Filled 2019-12-24: qty 20

## 2019-12-24 MED ORDER — SCOPOLAMINE 1 MG/3DAYS TD PT72: 1.0000 | MEDICATED_PATCH | TRANSDERMAL | Status: DC

## 2019-12-24 MED ORDER — LIDOCAINE-EPINEPHRINE 1 %-1:100000 IJ SOLN
INTRAMUSCULAR | Status: DC | PRN
  Administered 2019-12-24: 13 mL

## 2019-12-24 MED ORDER — DEXAMETHASONE SODIUM PHOSPHATE 10 MG/ML IJ SOLN
INTRAMUSCULAR | Status: DC | PRN
  Administered 2019-12-24: 10 mg via INTRAVENOUS

## 2019-12-24 MED ORDER — FENTANYL CITRATE (PF) 250 MCG/5ML IJ SOLN
INTRAMUSCULAR | Status: AC
  Filled 2019-12-24: qty 5

## 2019-12-24 MED ORDER — HYDROMORPHONE HCL 1 MG/ML IJ SOLN
1.0000 mg | Freq: Once | INTRAMUSCULAR | Status: DC
  Filled 2019-12-24: qty 1

## 2019-12-24 MED ORDER — FENTANYL CITRATE (PF) 250 MCG/5ML IJ SOLN
INTRAMUSCULAR | Status: DC | PRN
Start: 2019-12-24 — End: 2019-12-25
  Administered 2019-12-24: 50 ug via INTRAVENOUS

## 2019-12-24 MED ORDER — LACTATED RINGERS IV SOLN: INTRAVENOUS | Status: DC | PRN

## 2019-12-24 MED ORDER — OXYCODONE HCL 5 MG PO TABS: 5.0000 mg | ORAL_TABLET | Freq: Once | ORAL | Status: DC | PRN

## 2019-12-24 MED ORDER — MEPERIDINE HCL 25 MG/ML IJ SOLN: 6.2500 mg | INTRAMUSCULAR | Status: DC | PRN

## 2019-12-24 MED ORDER — MIDAZOLAM HCL 2 MG/2ML IJ SOLN
INTRAMUSCULAR | Status: AC
  Filled 2019-12-24: qty 2

## 2019-12-24 MED ORDER — FENTANYL CITRATE (PF) 100 MCG/2ML IJ SOLN: 25.0000 ug | INTRAMUSCULAR | Status: DC | PRN

## 2019-12-24 MED ORDER — MIDAZOLAM HCL 2 MG/2ML IJ SOLN: 0.5000 mg | Freq: Once | INTRAMUSCULAR | Status: DC | PRN

## 2019-12-24 SURGICAL SUPPLY — 25 items
CATH ROBINSON RED A/P 16FR (CATHETERS) ×4 IMPLANT
CNTNR URN SCR LID CUP LEK RST (MISCELLANEOUS) ×1 IMPLANT
CONT SPEC 4OZ STRL OR WHT (MISCELLANEOUS) ×2
DECANTER SPIKE VIAL GLASS SM (MISCELLANEOUS) ×4 IMPLANT
DRSG TELFA 3X8 NADH (GAUZE/BANDAGES/DRESSINGS) ×2 IMPLANT
FILTER UTR ASPR ASSEMBLY (MISCELLANEOUS) ×2 IMPLANT
GLOVE BIOGEL PI IND STRL 7.0 (GLOVE) ×4 IMPLANT
GLOVE BIOGEL PI INDICATOR 7.0 (GLOVE) ×4
GLOVE ECLIPSE 7.0 STRL STRAW (GLOVE) ×6 IMPLANT
GOWN STRL REUS W/ TWL LRG LVL3 (GOWN DISPOSABLE) ×4 IMPLANT
GOWN STRL REUS W/TWL LRG LVL3 (GOWN DISPOSABLE) ×8
HOSE CONNECTING 18IN BERKELEY (TUBING) ×2 IMPLANT
KIT BERKELEY 1ST TRI 3/8 NO TR (MISCELLANEOUS) ×2 IMPLANT
KIT BERKELEY 1ST TRIMESTER 3/8 (MISCELLANEOUS) ×2 IMPLANT
NS IRRIG 1000ML POUR BTL (IV SOLUTION) ×2 IMPLANT
PACK VAGINAL MINOR WOMEN LF (CUSTOM PROCEDURE TRAY) ×4 IMPLANT
PAD DRESSING TELFA 3X8 NADH (GAUZE/BANDAGES/DRESSINGS) ×1 IMPLANT
PAD OB MATERNITY 4.3X12.25 (PERSONAL CARE ITEMS) ×4 IMPLANT
SET BERKELEY SUCTION TUBING (SUCTIONS) ×2 IMPLANT
TOWEL GREEN STERILE FF (TOWEL DISPOSABLE) ×8 IMPLANT
UNDERPAD 30X36 HEAVY ABSORB (UNDERPADS AND DIAPERS) ×4 IMPLANT
VACURETTE 10 RIGID CVD (CANNULA) ×1 IMPLANT
VACURETTE 7MM CVD STRL WRAP (CANNULA) IMPLANT
VACURETTE 8 RIGID CVD (CANNULA) IMPLANT
VACURETTE 9 RIGID CVD (CANNULA) IMPLANT

## 2019-12-24 NOTE — Op Note (Signed)
Mariah Sosa  PROCEDURE DATE: 12/24/2019  PREOPERATIVE DIAGNOSIS:incompete abortion, possible gestational trophoblastic disease.  POSTOPERATIVE DIAGNOSIS: The same.  PROCEDURE:    Suction Dilation and Evacuation.  SURGEON:  Reva Bores  ANESTHESIA: Jairo Ben, MD  INDICATIONS: 24 y.o. G1P0with Incomplete AB at [redacted] weeks gestation, needing surgical completion.  Risks of surgery were discussed with the patient including but not limited to: bleeding which may require transfusion; infection which may require antibiotics; injury to uterus or surrounding organs;need for additional procedures including laparotomy or laparoscopy; possibility of intrauterine scarring which may impair future fertility; and other postoperative/anesthesia complications. Written informed consent was obtained.    FINDINGS:  A 10 wk size anteverted/midline/retroverted uterus, moderate amounts of products of conception, specimen sent to pathology.  ANESTHESIA:    Monitored intravenous sedation, paracervical block.  ESTIMATED BLOOD LOSS:  Less than 20 ml.  SPECIMENS:  Products of conception sent to pathology  COMPLICATIONS:  None immediate.  PROCEDURE DETAILS:  The patient received intravenous antibiotics while in the preoperative area.  She was then taken to the operating room where general anesthesia was administered and was found to be adequate.  After an adequate timeout was performed, she was placed in the dorsal lithotomy position and examined; then prepped and draped in the sterile manner.   Her bladder was catheterized for an unmeasured amount of clear, yellow urine. A vaginal speculum was then placed in the patient's vagina and a single tooth tenaculum was applied to the anterior lip of the cervix.  A paracervical block using 1% Lidocaine with Epinephrine was administered. The cervix was gently dilated to accommodate a 10 mm suction curette that was gently advanced to the uterine fundus.  The suction  device was then activated and curette slowly rotated to clear the uterus of products of conception.  A sharp curettage was then performed to confirm complete emptying of the uterus.There was minimal bleeding noted and the tenaculum removed with good hemostasis noted.  All insturmnent, needle and lap counts were correct x 2.The patient tolerated the procedure well.  The patient was taken to the recovery area in stable condition.  Reva Bores 12/24/2019 11:49 PM

## 2019-12-24 NOTE — MAU Provider Note (Signed)
Patient Mariah Sosa is a 24 y.o.  G1P0  at [redacted]w[redacted]d here with complaints of abdominal pain and vaginal bleeding. The pain is a 8/10. 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    She is scheduled for D and C but is here tonight because she has been having worsening pain and bleeding over the weekend.   Last food intake was 5-6 pm.   History     CSN: [id number]  Arrival date and time: 12/24/19 2045   First Provider Initiated Contact with Patient 12/24/19 2124      Chief Complaint  Patient presents with  . Vaginal Bleeding  . Abdominal Pain   Abdominal Pain This is a new problem. The current episode started yesterday. The problem occurs constantly. The pain is located in the LLQ, RLQ and generalized abdominal region. The pain is at a severity of 10/10. The quality of the pain is cramping. The abdominal pain radiates to the back. Associated symptoms include nausea. Pertinent negatives include no constipation, diarrhea, dysuria or vomiting.  Vaginal Bleeding The patient's primary symptoms include vaginal bleeding. This is a new problem. The current episode started yesterday. The problem occurs constantly. The problem has been gradually worsening. She is pregnant. Associated symptoms include abdominal pain and nausea. Pertinent negatives include no constipation, diarrhea, dysuria or vomiting. The vaginal discharge was bloody. The vaginal bleeding is heavier than menses. She has been passing clots.    OB History     Gravida  1   Para      Term      Preterm      AB      Living         SAB      TAB      Ectopic      Multiple      Live Births              Past Medical History:  Diagnosis Date  . Headache(784.0)   . Trichimoniasis 12/18/2019    Past Surgical History:  Procedure Laterality Date  . NO PAST SURGERIES      Family History  Problem Relation Age of Onset  . Heart  failure Paternal Grandmother     Social History   Tobacco Use  . Smoking status: Never Smoker  . Smokeless tobacco: Never Used  Vaping Use  . Vaping Use: Never used  Substance Use Topics  . Alcohol use: No  . Drug use: No    Allergies: No Known Allergies  No medications prior to admission.    Review of Systems  Constitutional: Negative.   HENT: Negative.   Respiratory: Negative.   Gastrointestinal: Positive for abdominal pain and nausea. Negative for constipation, diarrhea and vomiting.  Genitourinary: Positive for vaginal bleeding and vaginal pain. Negative for dysuria.  Neurological: Negative.   Hematological: Negative.   Psychiatric/Behavioral: Negative.    Physical Exam   Last menstrual period 10/13/2019.  Physical Exam HENT:     Head: Normocephalic.  Abdominal:     Tenderness: There is generalized abdominal tenderness.  Genitourinary:    Vagina: Normal.  Neurological:     Mental Status: She is alert.     MAU Course  Procedures  MDM -Patient very uncomfortable during exam, passing large clots,  will draw stat labs, stat COVID-19 PCR, start IV  Dr. Shawnie Pons aware of patient's presence on the unit.  -[place] will come to the bedside >  US shows still with thickened endomentrium, no mention of free fluid, no intrauterine or ectopic pregnancy visualized.  -Dr. Shawnie Pons aware and at the bedside in MAU.  Assessment and Plan  -will proceed with D and C tonight  Charlesetta Garibaldi [facility] 12/24/2019, 9:24 PM

## 2019-12-24 NOTE — Anesthesia Preprocedure Evaluation (Addendum)
Anesthesia Evaluation  Patient identified by MRN, date of birth, ID band Patient awake    Reviewed: Allergy & Precautions, NPO status , Patient's Chart, lab work & pertinent test results  History of Anesthesia Complications Negative for: history of anesthetic complications  Airway Mallampati: II  TM Distance: >3 FB Neck ROM: Full    Dental  (+) Dental Advisory Given   Pulmonary neg pulmonary ROS,  12/24/2019 SARS coronavirus NEG   breath sounds clear to auscultation       Cardiovascular negative cardio ROS   Rhythm:Regular Rate:Normal     Neuro/Psych  Headaches,    GI/Hepatic negative GI ROS, Neg liver ROS,   Endo/Other  negative endocrine ROS  Renal/GU negative Renal ROS     Musculoskeletal   Abdominal   Peds  Hematology  (+) Blood dyscrasia (Hb 10.8), anemia ,   Anesthesia Other Findings   Reproductive/Obstetrics (+) Pregnancy Incomplete abortion                            Anesthesia Physical Anesthesia Plan  ASA: II and emergent  Anesthesia Plan: General   Post-op Pain Management:    Induction: Intravenous  PONV Risk Score and Plan: 3 and Ondansetron, Dexamethasone and Scopolamine patch - Pre-op  Airway Management Planned: Oral ETT  Additional Equipment: None  Intra-op Plan:   Post-operative Plan: Extubation in OR  Informed Consent: I have reviewed the patients History and Physical, chart, labs and discussed the procedure including the risks, benefits and alternatives for the proposed anesthesia with the patient or authorized representative who has indicated his/her understanding and acceptance.     Dental advisory given  Plan Discussed with: CRNA and Surgeon  Anesthesia Plan Comments:        Anesthesia Quick Evaluation

## 2019-12-24 NOTE — Discharge Instructions (Signed)
Dilation and Curettage or Vacuum Curettage, Care After These instructions give you information about caring for yourself after your procedure. Your doctor may also give you more specific instructions. Call your doctor if you have any problems or questions after your procedure. Follow these instructions at home: Activity  Do not drive or use heavy machinery while taking prescription pain medicine.  For 24 hours after your procedure, avoid driving.  Take short walks often, followed by rest periods. Ask your doctor what activities are safe for you. After one or two days, you may be able to return to your normal activities.  Do not lift anything that is heavier than 10 lb (4.5 kg) until your doctor approves.  For at least 2 weeks, or as long as told by your doctor: ? Do not douche. ? Do not use tampons. ? Do not have sex. General instructions   Take over-the-counter and prescription medicines only as told by your doctor. This is very important if you take blood thinning medicine.  Do not take baths, swim, or use a hot tub until your doctor approves. Take showers instead of baths.  Wear compression stockings as told by your doctor.  It is up to you to get the results of your procedure. Ask your doctor when your results will be ready.  Keep all follow-up visits as told by your doctor. This is important. Contact a doctor if:  You have very bad cramps that get worse or do not get better with medicine.  You have very bad pain in your belly (abdomen).  You cannot drink fluids without throwing up (vomiting).  You get pain in a different part of the area between your belly and thighs (pelvis).  You have bad-smelling discharge from your vagina.  You have a rash. Get help right away if:  You are bleeding a lot from your vagina. A lot of bleeding means soaking more than one sanitary pad in an hour, for 2 hours in a row.  You have clumps of blood (blood clots) coming from your  vagina.  You have a fever or chills.  Your belly feels very tender or hard.  You have chest pain.  You have trouble breathing.  You cough up blood.  You feel dizzy.  You feel light-headed.  You pass out (faint).  You have pain in your neck or shoulder area. Summary  Take short walks often, followed by rest periods. Ask your doctor what activities are safe for you. After one or two days, you may be able to return to your normal activities.  Do not lift anything that is heavier than 10 lb (4.5 kg) until your doctor approves.  Do not take baths, swim, or use a hot tub until your doctor approves. Take showers instead of baths.  Contact your doctor if you have any symptoms of infection, like bad-smelling discharge from your vagina. This information is not intended to replace advice given to you by your health care provider. Make sure you discuss any questions you have with your health care provider. Document Revised: 12/25/2016 Document Reviewed: 09/30/2015 Elsevier Patient Education  2020 [employer]vier Inc[employer]  

## 2019-12-24 NOTE — H&P (Signed)
  Mariah Sosa is an 24 y.o. G1P0 female.   Chief Complaint: bleeding, cramping HPI: here with bleeding, passing clots. Has h/o probable failed IUP based on appearance of u/s, with possible gestational trophoblastic disease. Tried to schedule for D and E this week, but now here with increased bleeding.  Past Medical History:  Diagnosis Date  . Headache(784.0)   . Trichimoniasis 12/18/2019    Past Surgical History:  Procedure Laterality Date  . NO PAST SURGERIES      Family History  Problem Relation Age of Onset  . Heart failure Paternal Grandmother    Social History:  reports that she has never smoked. She has never used smokeless tobacco. She reports that she does not drink alcohol and does not use drugs.  Allergies: No Known Allergies  No medications prior to admission.    A comprehensive review of systems was negative.  Blood pressure (!) 132/96, pulse 89, temperature 97.8 F (36.6 C), temperature source Oral, resp. rate 20, last menstrual period 10/13/2019. General appearance: alert, cooperative and appears stated age Head: Normocephalic, without obvious abnormality, atraumatic Neck: supple, symmetrical, trachea midline Lungs: normal effort Heart: regular rate and rhythm Abdomen: soft, non-tender; bowel sounds normal; no masses,  no organomegaly Extremities: extremities normal, atraumatic, no cyanosis or edema Skin: Skin color, texture, turgor normal. No rashes or lesions Neurologic: Grossly normal   Lab Results  Component Value Date   WBC 10.1 12/24/2019   HGB 10.8 (L) 12/24/2019   HCT 32.0 (L) 12/24/2019   MCV 95.5 12/24/2019   PLT 220 12/24/2019   Lab Results  Component Value Date   PREGTESTUR POSITIVE (A) 11/09/2019     Assessment/Plan Principal Problem:   Incomplete abortion Active Problems:   Gestational trophoblastic disease  For D and C with pathology to review for possible GTD. Risk of bleeding reviewed.  Risks include but are not limited  to bleeding, infection, injury to surrounding structures, including bowel, bladder and ureters, blood clots, and death.  Likelihood of success is high.    Reva Bores 12/24/2019, 10:54 PM

## 2019-12-24 NOTE — MAU Note (Signed)
Reports she stared cramping again tonight. Passing blood clots and tissue.

## 2019-12-24 NOTE — Anesthesia Procedure Notes (Addendum)
Procedure Name: Intubation Date/Time: 12/24/2019 11:35 PM Performed by: Suzy Bouchard, CRNA Pre-anesthesia Checklist: Patient identified, Emergency Drugs available, Patient being monitored, Suction available and Timeout performed Patient Re-evaluated:Patient Re-evaluated prior to induction Oxygen Delivery Method: Circle system utilized Preoxygenation: Pre-oxygenation with 100% oxygen Induction Type: IV induction and Rapid sequence Laryngoscope Size: Miller, 2, Mac and 4 Grade View: Grade II Tube type: Oral Tube size: 7.0 mm Number of attempts: 2 Airway Equipment and Method: Stylet Placement Confirmation: ETT inserted through vocal cords under direct vision,  positive ETCO2 and breath sounds checked- equal and bilateral Secured at: 23 cm Tube secured with: Tape Dental Injury: Teeth and Oropharynx as per pre-operative assessment  Comments: DL x1 Mil 2, unable to pick up epiglottis to see cords; DL x2 Jenita Seashore MD with Mac 4, Gr 2 view, atraum intubation, oropharynx clear on DL

## 2019-12-25 ENCOUNTER — Encounter (HOSPITAL_COMMUNITY): Payer: Self-pay | Admitting: Family Medicine

## 2019-12-25 DIAGNOSIS — O034 Incomplete spontaneous abortion without complication: Secondary | ICD-10-CM | POA: Diagnosis not present

## 2019-12-25 NOTE — Anesthesia Postprocedure Evaluation (Signed)
Anesthesia Post Note  Patient: Mariah Sosa  Procedure(s) Performed: DILATATION AND EVACUATION (N/A )     Patient location during evaluation: PACU Anesthesia Type: General Level of consciousness: awake and alert, patient cooperative and oriented Pain management: pain level controlled Vital Signs Assessment: post-procedure vital signs reviewed and stable Respiratory status: spontaneous breathing, nonlabored ventilation and respiratory function stable Cardiovascular status: blood pressure returned to baseline and stable Postop Assessment: no apparent nausea or vomiting, adequate PO intake and able to ambulate Anesthetic complications: no   No complications documented.  Last Vitals:  Vitals:   12/25/19 0020 12/25/19 0035  BP: 125/74 121/79  Pulse: 90 81  Resp: 19 15  Temp:    SpO2: 100% 100%    Last Pain:  Vitals:   12/25/19 0100  TempSrc:   PainSc: 0-No pain                 Annalynne Ibanez,E. Kerston Landeck

## 2019-12-25 NOTE — Transfer of Care (Signed)
Immediate Anesthesia Transfer of Care Note  Patient: Mariah Sosa  Procedure(s) Performed: DILATATION AND EVACUATION (N/A )  Patient Location: PACU  Anesthesia Type:General  Level of Consciousness: awake and alert   Airway & Oxygen Therapy: Patient Spontanous Breathing and Patient connected to nasal cannula oxygen  Post-op Assessment: Report given to RN and Post -op Vital signs reviewed and stable  Post vital signs: Reviewed and stable  Last Vitals:  Vitals Value Taken Time  BP    Temp    Pulse 110 12/25/19 0006  Resp 24 12/25/19 0006  SpO2 100 % 12/25/19 0006  Vitals shown include unvalidated device data.  Last Pain:  Vitals:   12/24/19 2225  TempSrc:   PainSc: 3          Complications: No complications documented.

## 2019-12-26 LAB — SURGICAL PATHOLOGY

## 2019-12-27 ENCOUNTER — Telehealth: Payer: Self-pay | Admitting: Family Medicine

## 2019-12-27 ENCOUNTER — Telehealth: Payer: Self-pay | Admitting: Lactation Services

## 2019-12-27 LAB — SURGICAL PATHOLOGY

## 2019-12-27 NOTE — Telephone Encounter (Signed)
Patient is being seen in the office on 12/08.

## 2019-12-27 NOTE — Telephone Encounter (Signed)
-----   Message from Reva Bores, MD sent at 12/26/2019  1:12 PM EST ----- Please schedule HCG early next week if not being seen for post op check next week.

## 2019-12-27 NOTE — Telephone Encounter (Signed)
Spoke with patient about her appointment with Dr. Shawnie Pons. She stated she will be here.

## 2020-01-03 ENCOUNTER — Other Ambulatory Visit: Payer: Self-pay | Admitting: *Deleted

## 2020-01-03 ENCOUNTER — Other Ambulatory Visit: Payer: Self-pay | Admitting: Lactation Services

## 2020-01-03 ENCOUNTER — Other Ambulatory Visit: Payer: Self-pay

## 2020-01-03 ENCOUNTER — Ambulatory Visit (INDEPENDENT_AMBULATORY_CARE_PROVIDER_SITE_OTHER): Payer: BC Managed Care – PPO | Admitting: Family Medicine

## 2020-01-03 ENCOUNTER — Encounter: Payer: Self-pay | Admitting: Family Medicine

## 2020-01-03 ENCOUNTER — Other Ambulatory Visit: Payer: Medicaid Other

## 2020-01-03 VITALS — BP 126/85 | HR 73 | Wt 111.2 lb

## 2020-01-03 DIAGNOSIS — Z0189 Encounter for other specified special examinations: Secondary | ICD-10-CM

## 2020-01-03 DIAGNOSIS — Z30011 Encounter for initial prescription of contraceptive pills: Secondary | ICD-10-CM | POA: Diagnosis not present

## 2020-01-03 DIAGNOSIS — O020 Blighted ovum and nonhydatidiform mole: Secondary | ICD-10-CM

## 2020-01-03 MED ORDER — NORETHIN ACE-ETH ESTRAD-FE 1-20 MG-MCG(24) PO TABS: 1.0000 | ORAL_TABLET | Freq: Every day | ORAL | 3 refills | Status: DC

## 2020-01-03 NOTE — Patient Instructions (Signed)
Molar Pregnancy  A molar pregnancy (hydatidiform mole) is a mass of tissue that grows in the uterus after an egg is fertilized incorrectly. The mass does not develop into a fetus, and is considered an abnormal pregnancy. Usually, the pregnancy ends on its own through miscarriage. In some cases, treatment may be required. What are the causes? This condition is caused by an egg that is fertilized incorrectly so that it has abnormal genetic material (chromosomes). This can result in one of two types of molar pregnancies:  Complete molar pregnancy. This is when all of the chromosomes in the fertilized egg are from the father, and none are from the mother.  Partial molar pregnancy. This is when the fertilized egg has chromosomes from the father and mother, but it has too many chromosomes. What increases the risk? This condition is more likely to develop in:  Women who are over the age of 57 or under the age of 42.  Women who have had a molar pregnancy in the past (very rare). Other possible risk factors include:  Smoking more than 15 cigarettes a day.  History of infertility.  Having a blood type A, B, or AB.  Having a lack (deficiency) of vitamin A.  Using birth control pills (oral contraceptives). What are the signs or symptoms? Symptoms of this condition include:  Vaginal bleeding.  Missed menstrual period.  The uterus growing faster than expected for a normal pregnancy.  Severe nausea and vomiting.  Severe pressure or pain in the uterus.  Abnormal ovarian cysts (theca lutein cysts).  Vaginal discharge that looks like grapes.  High blood pressure (early onset of preeclampsia).  Overactive thyroid gland (hyperthyroidism).  Not having enough red blood cells or hemoglobin (anemia). How is this diagnosed? This condition is diagnosed based on ultrasound and blood tests. How is this treated? Usually, molar pregnancies end on their own by miscarriage. A health care provider  may manage this condition by:  Monitoring the levels of pregnancy hormones in your blood to make sure that the hormone levels are decreasing as expected.  Giving you a medicine called Rho (D) immune globulin. This medicine helps to prevent problems that may occur in future pregnancies as a result of a protein on red blood cells (Rh factor). You may be given this medicine if you do not have an Rh factor (you are Rh negative) and your sex partner has an Rh factor (he is Rh positive).  Putting you on chemotherapy. This involves taking medicines that regulate levels of pregnancy hormones. This may be done if your pregnancy hormone levels are not decreasing as expected.  Performing a procedure called dilation and curettage (D&C), or vacuum curettage. These are minor procedures that involve scraping or suctioning the molar pregnancy out of the uterus and removing it through the vagina. Even if a molar pregnancy ends on its own, one of these procedures may be done to make sure that all the abnormal tissue is out of the uterus.  Doing a surgical removal of the uterus (hysterectomy). Follow these instructions at home:  Avoid getting pregnant for 6-12 months, or as long as told by your health care provider. To avoid getting pregnant, avoid having sex or use a reliable form of birth control every time you have sex.  Take over-the-counter and prescription medicines only as told by your health care provider.  Rest as needed, and slowly return to your normal activities.  Think about joining a support group. If you are struggling with grief, ask  your health care provider for help.  Keep all follow-up visits as told by your health care provider. This is important. You may need follow-up blood tests or ultrasounds. Contact a health care provider if:  You continue to have irregular vaginal bleeding.  You have abdominal pain. Summary  A molar pregnancy (hydatidiform mole) is a mass of tissue that grows in  the uterus after an egg is fertilized incorrectly.  This condition is more likely to develop in women who are over the age of 76 or under the age of 45 or women who have had a molar pregnancy in the past.  The most common symptom of this condition is vaginal bleeding.  Usually, molar pregnancy ends with a miscarriage, and no treatment is needed. This information is not intended to replace advice given to you by your health care provider. Make sure you discuss any questions you have with your health care provider. Document Revised: 12/25/2016 Document Reviewed: 03/18/2016 Elsevier Patient Education  2020 [employer].

## 2020-01-03 NOTE — Assessment & Plan Note (Signed)
Check HCG weekly until < 5, then go to monthly x 3 months. Risks reviewed at length.

## 2020-01-03 NOTE — Progress Notes (Signed)
   Subjective:    Patient ID: Mariah Sosa is a 24 y.o. female presenting with Post-op Follow-up  on 01/03/2020  HPI: S/p D and C for molar pregnancy. Pathology confirmed. No bleeding. No cramping.  Review of Systems  Constitutional: Negative for chills and fever.  Respiratory: Negative for shortness of breath.   Cardiovascular: Negative for chest pain.  Gastrointestinal: Negative for abdominal pain, nausea and vomiting.  Genitourinary: Negative for dysuria.  Skin: Negative for rash.      Objective:    BP 126/85   Pulse 73   Wt 111 lb 3.2 oz (50.4 kg)   LMP 10/13/2019 (Approximate)   Breastfeeding No   BMI 21.36 kg/m  Physical Exam Constitutional:      General: She is not in acute distress.    Appearance: She is well-developed.  HENT:     Head: Normocephalic and atraumatic.  Eyes:     General: No scleral icterus. Cardiovascular:     Rate and Rhythm: Normal rate.  Pulmonary:     Effort: Pulmonary effort is normal.  Abdominal:     Palpations: Abdomen is soft.  Musculoskeletal:     Cervical back: Neck supple.  Skin:    General: Skin is warm and dry.  Neurological:     Mental Status: She is alert and oriented to person, place, and time.         Assessment & Plan:   Problem List Items Addressed This Visit      Unprioritized   Molar pregnancy    Check HCG weekly until < 5, then go to monthly x 3 months. Risks reviewed at length.       Other Visit Diagnoses    Initiation of oral contraception    -  Primary   Discussed need for no pregnancy right now. Has tried and been successful w/ OC's. Will begin. Back-up method through first pack.   Relevant Medications   Norethindrone Acetate-Ethinyl Estrad-FE (LOESTRIN 24 FE) 1-20 MG-MCG(24) tablet      Total time in review of prior notes, pathology, labs, history taking, review with patient, exam, note writing, discussion of options, plan for next steps, alternatives and risks of treatment: 20 minutes.  Return  in about 1 week (around 01/10/2020) for lab only--4-8 wks for CPE.  Reva Bores 01/03/2020 4:23 PM

## 2020-01-03 NOTE — Progress Notes (Signed)
Encounter opened in error

## 2020-01-04 LAB — BETA HCG QUANT (REF LAB): hCG Quant: 178 m[IU]/mL

## 2020-01-09 ENCOUNTER — Other Ambulatory Visit: Payer: Self-pay | Admitting: *Deleted

## 2020-01-09 DIAGNOSIS — O020 Blighted ovum and nonhydatidiform mole: Secondary | ICD-10-CM

## 2020-01-10 ENCOUNTER — Other Ambulatory Visit: Payer: Self-pay

## 2020-01-10 ENCOUNTER — Other Ambulatory Visit: Payer: Medicaid Other

## 2020-01-10 DIAGNOSIS — O020 Blighted ovum and nonhydatidiform mole: Secondary | ICD-10-CM

## 2020-01-11 ENCOUNTER — Telehealth: Payer: Self-pay | Admitting: *Deleted

## 2020-01-11 ENCOUNTER — Encounter: Payer: Self-pay | Admitting: *Deleted

## 2020-01-11 DIAGNOSIS — O020 Blighted ovum and nonhydatidiform mole: Secondary | ICD-10-CM

## 2020-01-11 LAB — BETA HCG QUANT (REF LAB): hCG Quant: 38 m[IU]/mL

## 2020-01-11 NOTE — Telephone Encounter (Addendum)
-----   Message from Reva Bores, MD sent at 01/11/2020 10:16 AM EST ----- Re-check HCG in 1 week  12/16  1300 Called pt and left message stating that I am calling regarding test results. I will send a Mychart message with all of the information. If she is not abel to access her Mychart, she may call back and leave a message on the nurse voicemail to request a call back. Mychart message sent to pt letting her know results and plan for repeat hormone level in one week.

## 2020-01-17 ENCOUNTER — Other Ambulatory Visit: Payer: Medicaid Other

## 2020-01-17 ENCOUNTER — Other Ambulatory Visit: Payer: Self-pay

## 2020-01-17 DIAGNOSIS — O020 Blighted ovum and nonhydatidiform mole: Secondary | ICD-10-CM

## 2020-01-18 ENCOUNTER — Telehealth: Payer: Self-pay | Admitting: General Practice

## 2020-01-18 DIAGNOSIS — O020 Blighted ovum and nonhydatidiform mole: Secondary | ICD-10-CM

## 2020-01-18 LAB — BETA HCG QUANT (REF LAB): hCG Quant: 15 m[IU]/mL

## 2020-01-18 NOTE — Telephone Encounter (Signed)
Called patient, no answer- left message asking patient to call us back regarding results. Will send mychart message.

## 2020-01-18 NOTE — Telephone Encounter (Signed)
-----   Message from Reva Bores, MD sent at 01/18/2020  4:28 AM EST ----- HCG in 1 week

## 2020-02-12 ENCOUNTER — Ambulatory Visit: Payer: Medicaid Other | Admitting: Family Medicine

## 2020-02-29 ENCOUNTER — Ambulatory Visit (INDEPENDENT_AMBULATORY_CARE_PROVIDER_SITE_OTHER): Payer: PRIVATE HEALTH INSURANCE | Admitting: Family Medicine

## 2020-02-29 ENCOUNTER — Encounter: Payer: Self-pay | Admitting: Family Medicine

## 2020-02-29 ENCOUNTER — Other Ambulatory Visit (HOSPITAL_COMMUNITY)
Admission: RE | Admit: 2020-02-29 | Discharge: 2020-02-29 | Disposition: A | Discharge status: Home / Self Care | Payer: BC Managed Care – PPO | Source: Ambulatory Visit | Attending: Family Medicine | Admitting: Family Medicine

## 2020-02-29 ENCOUNTER — Other Ambulatory Visit: Payer: Self-pay

## 2020-02-29 VITALS — BP 129/93 | HR 87 | Ht 60.05 in | Wt 107.0 lb

## 2020-02-29 DIAGNOSIS — Z01419 Encounter for gynecological examination (general) (routine) without abnormal findings: Secondary | ICD-10-CM

## 2020-02-29 DIAGNOSIS — N6012 Diffuse cystic mastopathy of left breast: Secondary | ICD-10-CM

## 2020-02-29 DIAGNOSIS — O020 Blighted ovum and nonhydatidiform mole: Secondary | ICD-10-CM

## 2020-02-29 DIAGNOSIS — N6011 Diffuse cystic mastopathy of right breast: Secondary | ICD-10-CM

## 2020-02-29 DIAGNOSIS — Z113 Encounter for screening for infections with a predominantly sexual mode of transmission: Secondary | ICD-10-CM | POA: Diagnosis not present

## 2020-02-29 DIAGNOSIS — Z23 Encounter for immunization: Secondary | ICD-10-CM | POA: Diagnosis not present

## 2020-02-29 DIAGNOSIS — Z3041 Encounter for surveillance of contraceptive pills: Secondary | ICD-10-CM

## 2020-02-29 NOTE — Assessment & Plan Note (Signed)
Decrease caffeine intake and monitor.

## 2020-02-29 NOTE — Progress Notes (Signed)
  Subjective:     Mariah Sosa is a 25 y.o. female and is here for a comprehensive physical exam. The patient reports on-going spotting. Had molar pregnancy in 11/21. Did not follow HCGs to zero. On Loestrin. Into her second pack.     The following portions of the patient's history were reviewed and updated as appropriate: allergies, current medications, past family history, past medical history, past social history, past surgical history and problem list.  Review of Systems Pertinent items noted in HPI and remainder of comprehensive ROS otherwise negative.   Objective:    BP (!) 129/93   Pulse 87   Ht 5' 0.05" (1.525 m)   Wt 107 lb (48.5 kg)   LMP 02/11/2020 (Exact Date)   Breastfeeding No   BMI 20.86 kg/m  General appearance: alert, cooperative and appears stated age Head: Normocephalic, without obvious abnormality, atraumatic Neck: no adenopathy, supple, symmetrical, trachea midline and thyroid not enlarged, symmetric, no tenderness/mass/nodules Lungs: clear to auscultation bilaterally Breasts: diffuse fibrocystic change in both breasts L>R and into axilla Heart: regular rate and rhythm, S1, S2 normal, no murmur, click, rub or gallop Abdomen: soft, non-tender; bowel sounds normal; no masses,  no organomegaly Pelvic: cervix normal in appearance, external genitalia normal, no adnexal masses or tenderness, no cervical motion tenderness, uterus normal size, shape, and consistency and vagina normal without discharge Extremities: extremities normal, atraumatic, no cyanosis or edema Pulses: 2+ and symmetric Skin: Skin color, texture, turgor normal. No rashes or lesions Lymph nodes: Cervical, supraclavicular, and axillary nodes normal. Neurologic: Grossly normal    Assessment:    Healthy female exam.      Plan:      Problem List Items Addressed This Visit      Unprioritized   Molar pregnancy    Check HCG levels today and monthly x 3 months if < 0.       Relevant  Orders   Beta hCG quant (ref lab)   Fibrocystic breast changes of both breasts    Decrease caffeine intake and monitor.       Other Visit Diagnoses    Well woman exam with routine gynecological exam    -  Primary   Relevant Orders   Cytology - PAP( [place])   Flu vaccine need       Relevant Orders   Flu Vaccine QUAD 36+ mos IM (Completed)   Surveillance of contraceptive pill       Having spotting, if continues, consider change to 35 microgram monophasic pill.     Return in 1 year (on 02/28/2021).  See After Visit Summary for Counseling Recommendations

## 2020-02-29 NOTE — Patient Instructions (Signed)
Preventive Care 11-23 Years Old, Female Preventive care refers to lifestyle choices and visits with your health care provider that can promote health and wellness. This includes:  A yearly physical exam. This is also called an annual wellness visit.  Regular dental and eye exams.  Immunizations.  Screening for certain conditions.  Healthy lifestyle choices, such as: ? Eating a healthy diet. ? Getting regular exercise. ? Not using drugs or products that contain nicotine and tobacco. ? Limiting alcohol use. What can I expect for my preventive care visit? Physical exam Your health care provider may check your:  Height and weight. These may be used to calculate your BMI (body mass index). BMI is a measurement that tells if you are at a healthy weight.  Heart rate and blood pressure.  Body temperature.  Skin for abnormal spots. Counseling Your health care provider may ask you questions about your:  Past medical problems.  Family's medical history.  Alcohol, tobacco, and drug use.  Emotional well-being.  Home life and relationship well-being.  Sexual activity.  Diet, exercise, and sleep habits.  Work and work Statistician.  Access to firearms.  Method of birth control.  Menstrual cycle.  Pregnancy history. What immunizations do I need? Vaccines are usually given at various ages, according to a schedule. Your health care provider will recommend vaccines for you based on your age, medical history, and lifestyle or other factors, such as travel or where you work.   What tests do I need? Blood tests  Lipid and cholesterol levels. These may be checked every 5 years starting at age 64.  Hepatitis C test.  Hepatitis B test. Screening  Diabetes screening. This is done by checking your blood sugar (glucose) after you have not eaten for a while (fasting).  STD (sexually transmitted disease) testing, if you are at risk.  BRCA-related cancer screening. This may  be done if you have a family history of breast, ovarian, tubal, or peritoneal cancers.  Pelvic exam and Pap test. This may be done every 3 years starting at age 61. Starting at age 82, this may be done every 5 years if you have a Pap test in combination with an HPV test. Talk with your health care provider about your test results, treatment options, and if necessary, the need for more tests.   Follow these instructions at home: Eating and drinking  Eat a healthy diet that includes fresh fruits and vegetables, whole grains, lean protein, and low-fat dairy products.  Take vitamin and mineral supplements as recommended by your health care provider.  Do not drink alcohol if: ? Your health care provider tells you not to drink. ? You are pregnant, may be pregnant, or are planning to become pregnant.  If you drink alcohol: ? Limit how much you have to 0-1 drink a day. ? Be aware of how much alcohol is in your drink. In the [place] one drink equals one 12 oz bottle of beer (355 mL), one 5 oz glass of wine (148 mL), or one 1 oz glass of hard liquor (44 mL).   Lifestyle  Take daily care of your teeth and gums. Brush your teeth every morning and night with fluoride toothpaste. Floss one time each day.  Stay active. Exercise for at least 30 minutes 5 or more days each week.  Do not use any products that contain nicotine or tobacco, such as cigarettes, e-cigarettes, and chewing tobacco. If you need help quitting, ask your health care provider.  Do  not use drugs.  If you are sexually active, practice safe sex. Use a condom or other form of protection to prevent STIs (sexually transmitted infections).  If you do not wish to become pregnant, use a form of birth control. If you plan to become pregnant, see your health care provider for a prepregnancy visit.  Find healthy ways to cope with stress, such as: ? Meditation, yoga, or listening to music. ? Journaling. ? Talking to a trusted  person. ? Spending time with friends and family. Safety  Always wear your seat belt while driving or riding in a vehicle.  Do not drive: ? If you have been drinking alcohol. Do not ride with someone who has been drinking. ? When you are tired or distracted. ? While texting.  Wear a helmet and other protective equipment during sports activities.  If you have firearms in your house, make sure you follow all gun safety procedures.  Seek help if you have been physically or sexually abused. What's next?  Go to your health care provider once a year for an annual wellness visit.  Ask your health care provider how often you should have your eyes and teeth checked.  Stay up to date on all vaccines. This information is not intended to replace advice given to you by your health care provider. Make sure you discuss any questions you have with your health care provider. Document Revised: 09/10/2019 Document Reviewed: 09/23/2017 Elsevier Patient Education  2021 [employer].

## 2020-02-29 NOTE — Assessment & Plan Note (Signed)
Check HCG levels today and monthly x 3 months if < 0.

## 2020-03-01 ENCOUNTER — Telehealth: Payer: Self-pay

## 2020-03-01 LAB — BETA HCG QUANT (REF LAB): hCG Quant: <1 m[IU]/mL

## 2020-03-01 NOTE — Telephone Encounter (Addendum)
-----   Message from Reva Bores, MD sent at 03/01/2020 10:16 AM EST ----- Schedule repeat HCG in 1 month  Left message that I am calling in regards to scheduling a lab only appt in one month.  If you could please give [place] a call back.   Addison Naegeli, RN

## 2020-03-05 LAB — CYTOLOGY - PAP
Chlamydia: NEGATIVE
Comment: NEGATIVE
Comment: NEGATIVE
Comment: NORMAL
Diagnosis: NEGATIVE
Diagnosis: REACTIVE
Neisseria Gonorrhea: NEGATIVE
Trichomonas: NEGATIVE

## 2020-03-05 NOTE — Telephone Encounter (Signed)
Called pt and left message that I am sending a MyChart message.    Mariah Sosa

## 2020-03-28 ENCOUNTER — Telehealth: Payer: Self-pay | Admitting: *Deleted

## 2020-03-28 NOTE — Telephone Encounter (Signed)
Pt left message stating that she is calling to schedule appointment to have her hormone level checked.

## 2020-04-02 ENCOUNTER — Other Ambulatory Visit: Payer: Self-pay | Admitting: Lactation Services

## 2020-04-02 DIAGNOSIS — O020 Blighted ovum and nonhydatidiform mole: Secondary | ICD-10-CM

## 2020-04-03 ENCOUNTER — Other Ambulatory Visit: Payer: Self-pay

## 2020-04-03 ENCOUNTER — Other Ambulatory Visit: Payer: PRIVATE HEALTH INSURANCE

## 2020-04-03 DIAGNOSIS — O020 Blighted ovum and nonhydatidiform mole: Secondary | ICD-10-CM

## 2020-04-04 LAB — BETA HCG QUANT (REF LAB): hCG Quant: <1 m[IU]/mL

## 2021-04-27 ENCOUNTER — Inpatient Hospital Stay (HOSPITAL_COMMUNITY)
Admission: AD | Admit: 2021-04-27 | Discharge: 2021-04-27 | Disposition: A | Discharge status: Home / Self Care | Payer: BC Managed Care – PPO | Attending: Obstetrics and Gynecology | Admitting: Obstetrics and Gynecology

## 2021-04-27 ENCOUNTER — Ambulatory Visit (HOSPITAL_COMMUNITY): Admission: EM | Admit: 2021-04-27 | Discharge: 2021-04-27 | Payer: BC Managed Care – PPO

## 2021-04-27 ENCOUNTER — Inpatient Hospital Stay (HOSPITAL_COMMUNITY): Payer: BC Managed Care – PPO

## 2021-04-27 ENCOUNTER — Encounter (HOSPITAL_COMMUNITY): Payer: Self-pay | Admitting: Obstetrics and Gynecology

## 2021-04-27 ENCOUNTER — Other Ambulatory Visit: Payer: Self-pay

## 2021-04-27 DIAGNOSIS — R109 Unspecified abdominal pain: Secondary | ICD-10-CM

## 2021-04-27 DIAGNOSIS — R1032 Left lower quadrant pain: Secondary | ICD-10-CM | POA: Insufficient documentation

## 2021-04-27 DIAGNOSIS — Z3490 Encounter for supervision of normal pregnancy, unspecified, unspecified trimester: Secondary | ICD-10-CM

## 2021-04-27 DIAGNOSIS — Z3A01 Less than 8 weeks gestation of pregnancy: Secondary | ICD-10-CM | POA: Insufficient documentation

## 2021-04-27 DIAGNOSIS — O26891 Other specified pregnancy related conditions, first trimester: Secondary | ICD-10-CM | POA: Insufficient documentation

## 2021-04-27 DIAGNOSIS — O26851 Spotting complicating pregnancy, first trimester: Secondary | ICD-10-CM | POA: Diagnosis not present

## 2021-04-27 LAB — COMPREHENSIVE METABOLIC PANEL
ALT: 17 U/L (ref 0–44)
AST: 16 U/L (ref 15–41)
Albumin: 3.7 g/dL (ref 3.5–5.0)
Alkaline Phosphatase: 33 U/L — ABNORMAL LOW (ref 38–126)
Anion gap: 4 — ABNORMAL LOW (ref 5–15)
BUN: 7 mg/dL (ref 6–20)
CO2: 26 mmol/L (ref 22–32)
Calcium: 9.1 mg/dL (ref 8.9–10.3)
Chloride: 109 mmol/L (ref 98–111)
Creatinine, Ser: 0.56 mg/dL (ref 0.44–1.00)
GFR, Estimated: >60 mL/min (ref >60)
Glucose, Bld: 99 mg/dL (ref 70–99)
Potassium: 3.7 mmol/L (ref 3.5–5.1)
Sodium: 139 mmol/L (ref 135–145)
Total Bilirubin: 0.4 mg/dL (ref 0.3–1.2)
Total Protein: 6.2 g/dL — ABNORMAL LOW (ref 6.5–8.1)

## 2021-04-27 LAB — URINALYSIS, ROUTINE W REFLEX MICROSCOPIC
Bilirubin Urine: NEGATIVE
Glucose, UA: NEGATIVE mg/dL
Hgb urine dipstick: NEGATIVE
Ketones, ur: NEGATIVE mg/dL
Leukocytes,Ua: NEGATIVE
Nitrite: NEGATIVE
Protein, ur: NEGATIVE mg/dL
Specific Gravity, Urine: 1.02 (ref 1.005–1.030)
pH: 6 (ref 5.0–8.0)

## 2021-04-27 LAB — CBC
HCT: 33.5 % — ABNORMAL LOW (ref 36.0–46.0)
Hemoglobin: 11.5 g/dL — ABNORMAL LOW (ref 12.0–15.0)
MCH: 32.9 pg (ref 26.0–34.0)
MCHC: 34.3 g/dL (ref 30.0–36.0)
MCV: 95.7 fL (ref 80.0–100.0)
Platelets: 161 10*3/uL (ref 150–400)
RBC: 3.5 MIL/uL — ABNORMAL LOW (ref 3.87–5.11)
RDW: 12.1 % (ref 11.5–15.5)
WBC: 6.9 10*3/uL (ref 4.0–10.5)
nRBC: 0 % (ref 0.0–0.2)

## 2021-04-27 LAB — HCG, QUANTITATIVE, PREGNANCY: hCG, Beta Chain, Quant, S: 3966 m[IU]/mL — ABNORMAL HIGH (ref <5)

## 2021-04-27 LAB — POCT PREGNANCY, URINE: Preg Test, Ur: POSITIVE — AB

## 2021-04-27 LAB — WET PREP, GENITAL
Sperm: NONE SEEN
Trich, Wet Prep: NONE SEEN
WBC, Wet Prep HPF POC: <10 (ref <10)
Yeast Wet Prep HPF POC: NONE SEEN

## 2021-04-27 MED ORDER — METRONIDAZOLE 0.75 % VA GEL: 1.0000 | Freq: Every day | VAGINAL | 0 refills | Status: DC

## 2021-04-27 NOTE — ED Notes (Signed)
Patient is being discharged from the Urgent Care and sent to the Emergency Department via personal vehicle . Per Provider Guy Sandifer, patient is in need of higher level of care due to bleeding & abdominal cramping in early pregnancy. Patient is aware and verbalizes understanding of plan of care. There were no vitals filed for this visit.  ?

## 2021-04-27 NOTE — MAU Note (Signed)
Mariah Sosa is a 26 y.o. at [redacted]w[redacted]d here in MAU reporting: on Tuesday started to notice some light vaginal bleeding. Bleeding has continued intermittently. Also having left sided abdominal pain since Wednesday or Thursday. ? ?LMP: 03/08/2021 ? ?Onset of complaint: ongoing ? ?Pain score: 2/10 ? ?Vitals:  ? 04/27/21 1323  ?BP: 125/81  ?Pulse: 79  ?Resp: 16  ?Temp: 99 ?F (37.2 ?C)  ?SpO2: 100%  ?   ?Lab orders placed from triage: ua, upt ? ?

## 2021-04-27 NOTE — MAU Provider Note (Signed)
?History  ?  ? ?CSN: [id number] ? ?Arrival date and time: 04/27/21 1251 ? ? Event Date/Time  ? First Provider Initiated Contact with Patient 04/27/21 1347   ?  ? ?Chief Complaint  ?Patient presents with  ? Abdominal Pain  ? Vaginal Bleeding  ? ?Mariah Sosa is a 26 y.o. G2P0010 at [redacted]w[redacted]d by Definite LMP.  She presents today for Abdominal Pain and Vaginal Bleeding.  She states she has been experiencing intermittent cramping since Tuesday afternoon.  She states the cramping is located on her left lower side and is "sharp."  She states the pain is worse with feeling of fullness or after eating and has not identified any improving factors.  Patient is remote from sexual activity and does endorses constipation.  She rates her pain a 2-3/10. She reports her last BM was this morning and was not hard to pass, but she has not had one since Thursday.  She states she also started having intermittent spotting, with wiping, around the same time.  She reports it is occasionally bright red and has required her to change her underwear on 1-2 occassions.  ? ? ?OB History   ? ? Gravida  ?2  ? Para  ?0  ? Term  ?0  ? Preterm  ?0  ? AB  ?1  ? Living  ?0  ?  ? ? SAB  ?0  ? IAB  ?0  ? Ectopic  ?0  ? Multiple  ?0  ? Live Births  ?0  ?   ?  ?  ? ? ?Past Medical History:  ?Diagnosis Date  ? Headache(784.0)   ? Trichimoniasis 12/18/2019  ? ? ?Past Surgical History:  ?Procedure Laterality Date  ? DILATION AND EVACUATION N/A 12/24/2019  ? Procedure: DILATATION AND EVACUATION;  Surgeon: Reva Bores, MD;  Location: [facility] OR;  Service: Gynecology;  Laterality: N/A;  ? NO PAST SURGERIES    ? ? ?Family History  ?Problem Relation Age of Onset  ? Heart failure Paternal Grandmother   ? ? ?Social History  ? ?Tobacco Use  ? Smoking status: Every Day  ?  Types: Cigars  ? Smokeless tobacco: Never  ? Tobacco comments:  ?  2-3/day  ?Vaping Use  ? Vaping Use: Never used  ?Substance Use Topics  ? Alcohol use: No  ? Drug use: No  ? ? ?Allergies: No Known  Allergies ? ?Medications Prior to Admission  ?Medication Sig Dispense Refill Last Dose  ? Multiple Vitamins-Minerals (WOMENS MULTIVITAMIN PO) Take by mouth.     ? Norethindrone Acetate-Ethinyl Estrad-FE (LOESTRIN 24 FE) 1-20 MG-MCG(24) tablet Take 1 tablet by mouth daily. 84 tablet 3   ? ? ?Review of Systems  ?Constitutional:  Negative for chills and fever.  ?Gastrointestinal:  Negative for diarrhea, nausea and vomiting.  ?Genitourinary:  Negative for difficulty urinating, dysuria, vaginal bleeding and vaginal discharge.  ?Neurological:  Negative for headaches.  ?Physical Exam  ? ?Blood pressure 125/81, pulse 79, temperature 99 ?F (37.2 ?C), temperature source Oral, resp. rate 16, height 5' (1.524 m), weight 49.6 kg, last menstrual period 03/08/2021, SpO2 100 %. ? ?Physical Exam ?Vitals reviewed.  ?Constitutional:   ?   Appearance: Normal appearance. She is well-developed.  ?HENT:  ?   Head: Normocephalic and atraumatic.  ?Pulmonary:  ?   Effort: Pulmonary effort is normal. No respiratory distress.  ?Abdominal:  ?   Palpations: Abdomen is soft.  ?   Tenderness: There is abdominal tenderness in the left lower  quadrant.  ?Musculoskeletal:     ?   General: Normal range of motion.  ?   Cervical back: Normal range of motion.  ?   Right lower leg: No edema.  ?   Left lower leg: No edema.  ?Skin: ?   General: Skin is warm and dry.  ?Neurological:  ?   Mental Status: She is alert and oriented to person, place, and time.  ?Psychiatric:     ?   Mood and Affect: Mood normal.     ?   Behavior: Behavior normal.  ? ? ?MAU Course  ?Procedures ?Results for orders placed or performed during the hospital encounter of 04/27/21 (from the past 24 hour(s))  ?Urinalysis, Routine w reflex microscopic     Status: Abnormal  ? Collection Time: 04/27/21  1:10 PM  ?Result Value Ref Range  ? Color, Urine YELLOW YELLOW  ? APPearance CLOUDY (A) CLEAR  ? Specific Gravity, Urine 1.020 1.005 - 1.030  ? pH 6.0 5.0 - 8.0  ? Glucose, UA NEGATIVE  NEGATIVE mg/dL  ? Hgb urine dipstick NEGATIVE NEGATIVE  ? Bilirubin Urine NEGATIVE NEGATIVE  ? Ketones, ur NEGATIVE NEGATIVE mg/dL  ? Protein, ur NEGATIVE NEGATIVE mg/dL  ? Nitrite NEGATIVE NEGATIVE  ? Leukocytes,Ua NEGATIVE NEGATIVE  ?Pregnancy, urine POC     Status: Abnormal  ? Collection Time: 04/27/21  1:10 PM  ?Result Value Ref Range  ? Preg Test, Ur POSITIVE (A) NEGATIVE  ?Wet prep, genital     Status: Abnormal  ? Collection Time: 04/27/21  1:41 PM  ?Result Value Ref Range  ? Yeast Wet Prep HPF POC NONE SEEN NONE SEEN  ? Trich, Wet Prep NONE SEEN NONE SEEN  ? Clue Cells Wet Prep HPF POC PRESENT (A) NONE SEEN  ? WBC, Wet Prep HPF POC <10 <10  ? Sperm NONE SEEN   ?CBC     Status: Abnormal  ? Collection Time: 04/27/21  1:50 PM  ?Result Value Ref Range  ? WBC 6.9 4.0 - 10.5 K/uL  ? RBC 3.50 (L) 3.87 - 5.11 MIL/uL  ? Hemoglobin 11.5 (L) 12.0 - 15.0 g/dL  ? HCT 33.5 (L) 36.0 - 46.0 %  ? MCV 95.7 80.0 - 100.0 fL  ? MCH 32.9 26.0 - 34.0 pg  ? MCHC 34.3 30.0 - 36.0 g/dL  ? RDW 12.1 11.5 - 15.5 %  ? Platelets 161 150 - 400 K/uL  ? nRBC 0.0 0.0 - 0.2 %  ?Comprehensive metabolic panel     Status: Abnormal  ? Collection Time: 04/27/21  1:50 PM  ?Result Value Ref Range  ? Sodium 139 135 - 145 mmol/L  ? Potassium 3.7 3.5 - 5.1 mmol/L  ? Chloride 109 98 - 111 mmol/L  ? CO2 26 22 - 32 mmol/L  ? Glucose, Bld 99 70 - 99 mg/dL  ? BUN 7 6 - 20 mg/dL  ? Creatinine, Ser 0.56 0.44 - 1.00 mg/dL  ? Calcium 9.1 8.9 - 10.3 mg/dL  ? Total Protein 6.2 (L) 6.5 - 8.1 g/dL  ? Albumin 3.7 3.5 - 5.0 g/dL  ? AST 16 15 - 41 U/L  ? ALT 17 0 - 44 U/L  ? Alkaline Phosphatase 33 (L) 38 - 126 U/L  ? Total Bilirubin 0.4 0.3 - 1.2 mg/dL  ? GFR, Estimated >60 >60 mL/min  ? Anion gap 4 (L) 5 - 15  ? ?US OB LESS THAN 14 WEEKS WITH OB TRANSVAGINAL ? ?Result Date: 04/27/2021 ?CLINICAL DATA:  Pelvic  pain, vaginal bleeding EXAM: OBSTETRIC <14 WK [place] AND TRANSVAGINAL OB US DOPPLER ULTRASOUND OF OVARIES TECHNIQUE: Both transabdominal and transvaginal ultrasound  examinations were performed for complete evaluation of the gestation as well as the maternal uterus, adnexal regions, and pelvic cul-de-sac. Transvaginal technique was performed to assess early pregnancy. Color and duplex Doppler ultrasound was utilized to evaluate blood flow to the ovaries. COMPARISON:  None. FINDINGS: Intrauterine gestational sac: Single Yolk sac:  Not seen Embryo:  Not Cardiac Activity: Not seen MSD: 8.8 mm suggesting gestational age of [redacted] weeks 4 days. Subchorionic hemorrhage:  None visualized. Maternal uterus/adnexae: Unremarkable. Pulsed Doppler evaluation of both ovaries demonstrates normal appearing low-resistance arterial and venous waveforms. IMPRESSION: There is gestational sac within the fundus of the uterus. There is no demonstrable fetal pole or yolk sac within the gestational sac. Differential diagnostic possibilities would include very early normal IUP or failed gestation with incomplete abortion. Serial HCG estimations and follow-up sonogram as warranted should be considered in 1-2 weeks. Electronically Signed   By: Ernie Avena M.D.   On: 04/27/2021 14:56   ? ?MDM ?PE ?Cultures: Wet Prep and GC/CT ?Labs: UA, UPT, CBC, hCG ?Ultrasound ?Prescription  ?Assessment and Plan  ?26 year old, G2P0010  ?SIUP at 7.1 weeks ?Vaginal Bleeding ?Abdominal Pain ?H/O Molar Pregnancy ? ?-Reviewed POC with patient. ?-Exam performed.  ?-Cultures collected by self swab. ?-Patient offered pain medication and declines. ?-Send for [place] and await results.  ? ?Cherre Robins ?04/27/2021, 1:47 PM  ? ?Reassessment (3:19 PM) ?-Results as above ?-Informed of findings. ?-Discussed treatment of BV with vaginal gel and patient agreeable.  ?-Rx for metrogel sent to pharmacy on file.  ?-Reviewed US findings and need for repeat hCG in 48 hours. ?-Instructed to report to [facility] on Tuesday April 4th at 1300 for repeat lab. ?-Bleeding precautions reviewed. ?-Patient questions what could be causing left sided pain.   Reviewed round ligament/growing pains. ?-Encouraged usage of prn tylenol and/or warm compress. ?-Encouraged to call primary office or return to MAU if symptoms worsen or with the onset of new symptoms. ?-Discharged to home

## 2021-04-28 LAB — GC/CHLAMYDIA PROBE AMP (~~LOC~~) NOT AT ARMC
Chlamydia: NEGATIVE
Comment: NEGATIVE
Comment: NORMAL
Neisseria Gonorrhea: NEGATIVE

## 2021-04-29 ENCOUNTER — Ambulatory Visit (INDEPENDENT_AMBULATORY_CARE_PROVIDER_SITE_OTHER): Payer: BC Managed Care – PPO

## 2021-04-29 ENCOUNTER — Other Ambulatory Visit: Payer: Self-pay

## 2021-04-29 VITALS — BP 133/89 | HR 79 | Wt 107.1 lb

## 2021-04-29 DIAGNOSIS — O26891 Other specified pregnancy related conditions, first trimester: Secondary | ICD-10-CM

## 2021-04-29 DIAGNOSIS — R109 Unspecified abdominal pain: Secondary | ICD-10-CM

## 2021-04-29 LAB — BETA HCG QUANT (REF LAB): hCG Quant: 3444 m[IU]/mL

## 2021-04-29 NOTE — Progress Notes (Signed)
Beta HCG Follow-up Visit ? ?Mariah Sosa presents to [facility] for follow-up beta HCG lab. She was seen in MAU for left sided abdominal pain and vaginal bleeding on 04/27/21. Patient denies pain, bleeding today. Discussed with patient that we are following beta HCG levels today. Results will be back in approximately 2 hours. Valid contact number for patient confirmed. I will call the patient with results.  ? ?Beta HCG results: ?04/27/21 3,966  ?04/29/21 3,444  ?   ? ?Results and patient history reviewed with Dr. Para March, who states she suspects patient is starting to miscarry and recommends rechecking hCG in one week. Patient called and informed of plan for follow-up. Reviewed ectopic precautions. Patient verbalized understanding, denies any other questions.  ? ?Mariah Sosa ?04/29/2021  ?

## 2021-05-06 ENCOUNTER — Other Ambulatory Visit: Payer: BC Managed Care – PPO

## 2021-05-06 DIAGNOSIS — O26891 Other specified pregnancy related conditions, first trimester: Secondary | ICD-10-CM

## 2021-05-07 ENCOUNTER — Telehealth: Payer: Self-pay

## 2021-05-07 ENCOUNTER — Telehealth: Payer: Self-pay | Admitting: Family Medicine

## 2021-05-07 DIAGNOSIS — O200 Threatened abortion: Secondary | ICD-10-CM

## 2021-05-07 LAB — BETA HCG QUANT (REF LAB): hCG Quant: 4035 m[IU]/mL

## 2021-05-07 NOTE — Telephone Encounter (Signed)
Concerns addressed. See documentation in telephone encounter 05/07/21. ?

## 2021-05-07 NOTE — Telephone Encounter (Signed)
Patient was seen yesterday, she want a call back she have some questions  ?

## 2021-05-07 NOTE — Telephone Encounter (Addendum)
-----   Message from Milas Hock, MD sent at 05/07/2021 10:46 AM EDT ----- ?Beta went up (3966 > 3444 > 4035). She had [place] on 4/2 with no YS or FP but GS.  ?Please have her go for [place] and have her follow with MD after [place] as she will likely need it.  ?Please let her know.  ?Thanks, pad ? ? ?Called pt. Pt reports heavy bleeding since Monday, 05/05/21. Reports also passing some clots. Pt agreeable to [place] appt. First available is 05/14/21. Reviewed with Para March who approves [place] date and recommends repeat beta HCG 05/08/21 AM. Pt agreeable to plan.  ?

## 2021-05-08 ENCOUNTER — Other Ambulatory Visit: Payer: BC Managed Care – PPO

## 2021-05-08 DIAGNOSIS — O3680X0 Pregnancy with inconclusive fetal viability, not applicable or unspecified: Secondary | ICD-10-CM

## 2021-05-08 DIAGNOSIS — Z3490 Encounter for supervision of normal pregnancy, unspecified, unspecified trimester: Secondary | ICD-10-CM

## 2021-05-08 LAB — BETA HCG QUANT (REF LAB): hCG Quant: 2840 m[IU]/mL

## 2021-05-09 ENCOUNTER — Telehealth: Payer: Self-pay | Admitting: *Deleted

## 2021-05-09 DIAGNOSIS — O200 Threatened abortion: Secondary | ICD-10-CM

## 2021-05-09 NOTE — Telephone Encounter (Signed)
Received a voicemail from yesterday pm stating she wants to speak to a nurse re: her bhcg that she sees has dropped.  ?Per chart review Dr. Para March has sent message to patient that it dropped which along with her bleeding unfortunately means miscarriage. She has repeat bhcg on same day as repeat US.  ?I called Mariah Sosa and informed her of result,reviewed message from Dr. Para March and reviewed [place] appointment. I informed her she will have bhcg in [facility] after [place] and results. ?She voices understanding. ?[name] ?

## 2021-05-13 ENCOUNTER — Telehealth: Payer: Self-pay

## 2021-05-14 ENCOUNTER — Ambulatory Visit (INDEPENDENT_AMBULATORY_CARE_PROVIDER_SITE_OTHER): Payer: BC Managed Care – PPO | Admitting: Obstetrics & Gynecology

## 2021-05-14 ENCOUNTER — Ambulatory Visit
Admission: RE | Admit: 2021-05-14 | Discharge: 2021-05-14 | Disposition: A | Discharge status: Home / Self Care | Payer: BC Managed Care – PPO | Source: Ambulatory Visit | Attending: Obstetrics and Gynecology | Admitting: Obstetrics and Gynecology

## 2021-05-14 ENCOUNTER — Ambulatory Visit: Payer: BC Managed Care – PPO

## 2021-05-14 ENCOUNTER — Inpatient Hospital Stay: Admission: RE | Admit: 2021-05-14 | Payer: BC Managed Care – PPO | Source: Ambulatory Visit

## 2021-05-14 VITALS — BP 120/80 | HR 74 | Ht 60.6 in | Wt 110.0 lb

## 2021-05-14 DIAGNOSIS — O200 Threatened abortion: Secondary | ICD-10-CM | POA: Diagnosis present

## 2021-05-14 MED ORDER — IBUPROFEN 600 MG PO TABS: 600.0000 mg | ORAL_TABLET | Freq: Four times a day (QID) | ORAL | 1 refills | Status: DC | PRN

## 2021-05-14 MED ORDER — MISOPROSTOL 200 MCG PO TABS: ORAL_TABLET | ORAL | 2 refills | Status: DC

## 2021-05-14 NOTE — Progress Notes (Signed)
Ultrasounds Results Note ? ?SUBJECTIVE ?HPI:  ?Mariah Sosa is a 26 y.o. G2P0010 at 104w4d by LMP who presents to the [facility] for followup ultrasound results. The patient has had  abdominal pain and vaginal bleeding.  ?4/2: Beta 3966, [place] with gestational sac, no YS/FP ?4/4: beta 3444 ?4/11: 4035 ?4/13: vaginal bleeding w/clots - beta 2840 ?Marland Kitchen  Repeat ultrasound was performed earlier today.  ? ?Past Medical History:  ?Diagnosis Date  ? Headache(784.0)   ? Trichimoniasis 12/18/2019  ? ?Past Surgical History:  ?Procedure Laterality Date  ? DILATION AND EVACUATION N/A 12/24/2019  ? Procedure: DILATATION AND EVACUATION;  Surgeon: Reva Bores, MD;  Location: [facility] OR;  Service: Gynecology;  Laterality: N/A;  ? NO PAST SURGERIES    ? ?Social History  ? ?Socioeconomic History  ? Marital status: Single  ?  Spouse name: Not on file  ? Number of children: Not on file  ? Years of education: Not on file  ? Highest education level: Not on file  ?Occupational History  ? Not on file  ?Tobacco Use  ? Smoking status: Every Day  ?  Types: Cigars  ? Smokeless tobacco: Never  ? Tobacco comments:  ?  2-3/day  ?Vaping Use  ? Vaping Use: Never used  ?Substance and Sexual Activity  ? Alcohol use: No  ? Drug use: No  ? Sexual activity: Yes  ?Other Topics Concern  ? Not on file  ?Social History Narrative  ? Not on file  ? ?Social Determinants of Health  ? ?Financial Resource Strain: Not on file  ?Food Insecurity: Not on file  ?Transportation Needs: Not on file  ?Physical Activity: Not on file  ?Stress: Not on file  ?Social Connections: Not on file  ?Intimate Partner Violence: Not on file  ? ?No current outpatient medications on file prior to visit.  ? ?No current facility-administered medications on file prior to visit.  ? ?No Known Allergies ? ?I have reviewed patient's Past Medical Hx, Surgical Hx, Family Hx, Social Hx, medications and allergies.  ? ?Review of Systems ?Review of Systems  ?Constitutional: Negative for  fever and chills.  ?Gastrointestinal: Negative for nausea, vomiting, abdominal pain, diarrhea and constipation.  ?Genitourinary: Negative for dysuria.  ?Musculoskeletal: Negative for back pain.  ?Neurological: Negative for dizziness and weakness.  ? ? ?Physical Exam  ?BP 120/80   Pulse 74   Ht 5' 0.6" (1.539 m)   Wt 110 lb (49.9 kg)   LMP 03/08/2021   BMI 21.06 kg/m?   ?GENERAL: Well-developed, well-nourished female in no acute distress.  ?HEENT: Normocephalic, atraumatic.   ?LUNGS: Effort normal ?ABDOMEN: soft, non-tender ?HEART: Regular rate  ?SKIN: Warm, dry and without erythema ?PSYCH: Normal mood and affect ?NEURO: Alert and oriented x 4 ? ?LAB RESULTS ?No results found for this or any previous visit (from the past 24 hour(s)). ? ?IMAGING ?US OB Transvaginal ? ?Result Date: 05/14/2021 ?CLINICAL DATA:  Vaginal bleeding, declining beta HCG levels. EXAM: TRANSVAGINAL OB ULTRASOUND TECHNIQUE: Transvaginal ultrasound was performed for complete evaluation of the gestation as well as the maternal uterus, adnexal regions, and pelvic cul-de-sac. COMPARISON:  04/27/2021. FINDINGS: Intrauterine gestational sac: Absent. Yolk sac:  Absent. Embryo:  Absent. Cardiac Activity: Absent. Maternal uterus/adnexae: Heterogeneous thickened endometrium with internal vascularity, measuring 9 mm. Ovaries are unremarkable. Trace pelvic free fluid. IMPRESSION: Heterogeneous thickened endometrium with internal vascularity. No gestational sac, as seen on the prior ultrasound. Findings meet definitive criteria for failed pregnancy. This follows SRU consensus  guidelines: Diagnostic Criteria for Nonviable Pregnancy Early in the First Trimester. Macy Mis J Med [id number]. Electronically Signed   By: Leanna Battles M.D.   On: 05/14/2021 15:41  ? ?US OB LESS THAN 14 WEEKS WITH OB TRANSVAGINAL ? ?Result Date: 04/27/2021 ?CLINICAL DATA:  Pelvic pain, vaginal bleeding EXAM: OBSTETRIC <14 WK [place] AND TRANSVAGINAL OB US DOPPLER ULTRASOUND OF  OVARIES TECHNIQUE: Both transabdominal and transvaginal ultrasound examinations were performed for complete evaluation of the gestation as well as the maternal uterus, adnexal regions, and pelvic cul-de-sac. Transvaginal technique was performed to assess early pregnancy. Color and duplex Doppler ultrasound was utilized to evaluate blood flow to the ovaries. COMPARISON:  None. FINDINGS: Intrauterine gestational sac: Single Yolk sac:  Not seen Embryo:  Not Cardiac Activity: Not seen MSD: 8.8 mm suggesting gestational age of [redacted] weeks 4 days. Subchorionic hemorrhage:  None visualized. Maternal uterus/adnexae: Unremarkable. Pulsed Doppler evaluation of both ovaries demonstrates normal appearing low-resistance arterial and venous waveforms. IMPRESSION: There is gestational sac within the fundus of the uterus. There is no demonstrable fetal pole or yolk sac within the gestational sac. Differential diagnostic possibilities would include very early normal IUP or failed gestation with incomplete abortion. Serial HCG estimations and follow-up sonogram as warranted should be considered in 1-2 weeks. Electronically Signed   By: Ernie Avena M.D.   On: 04/27/2021 14:56   ? ?ASSESSMENT ?Incomplete spontaneous abortion ? ? ?PLAN ?Discharge home in stable condition ?She was offered expectant management vs. Cytotec or D&C, and she requests medical management. ?Meds ordered this encounter  ?Medications  ? misoprostol (CYTOTEC) 200 MCG tablet  ?  Sig: Place two tablets in the vagina and take 2 by mouth  ?  Dispense:  4 tablet  ?  Refill:  2  ?RTC for HCG in 1 week, bleeding precautions given ? ? ?Adam Phenix, MD  ?05/14/2021  ?4:27 PM ? ? ? ? ? ? ?

## 2021-05-15 LAB — BETA HCG QUANT (REF LAB): hCG Quant: 2351 m[IU]/mL

## 2021-05-21 ENCOUNTER — Other Ambulatory Visit: Payer: Self-pay

## 2021-05-21 DIAGNOSIS — O200 Threatened abortion: Secondary | ICD-10-CM

## 2021-05-22 ENCOUNTER — Other Ambulatory Visit: Payer: BC Managed Care – PPO

## 2021-06-27 IMAGING — US US OB TRANSVAGINAL
1 series · 15 of 28 positions shown · non-contrast
Comparison: December 22, 2019

CLINICAL DATA: Heavy vaginal bleeding and pelvic pain.

EXAM:
OBSTETRIC <14 WK US AND TRANSVAGINAL OB US
TECHNIQUE: Both transabdominal and transvaginal ultrasound examinations were
performed for complete evaluation of the gestation as well as the
maternal uterus, adnexal regions, and pelvic cul-de-sac.
Transvaginal technique was performed to assess early pregnancy.

[Series 1: us ob transvaginal · 15 of 32 slices shown]
[im 1/32]
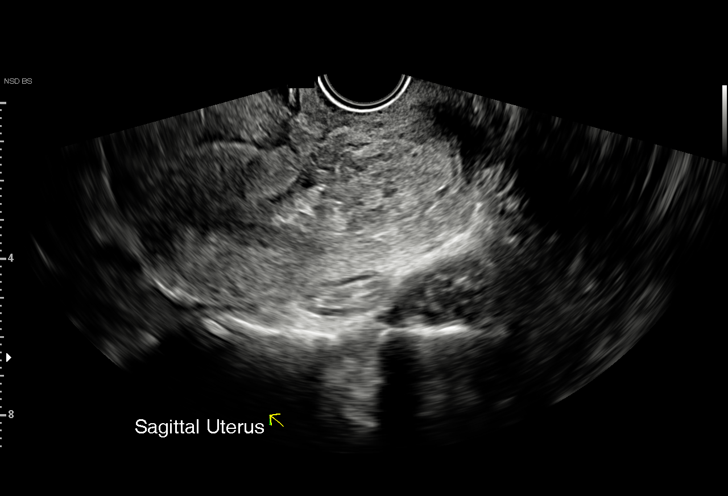
[im 3/32]
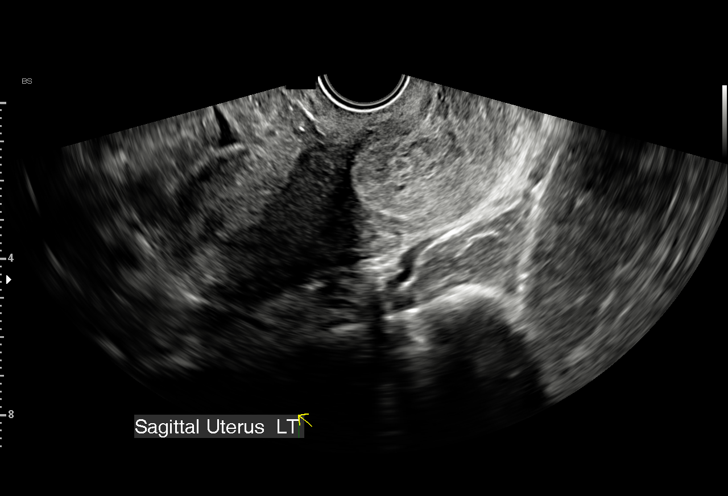
[im 5/32]
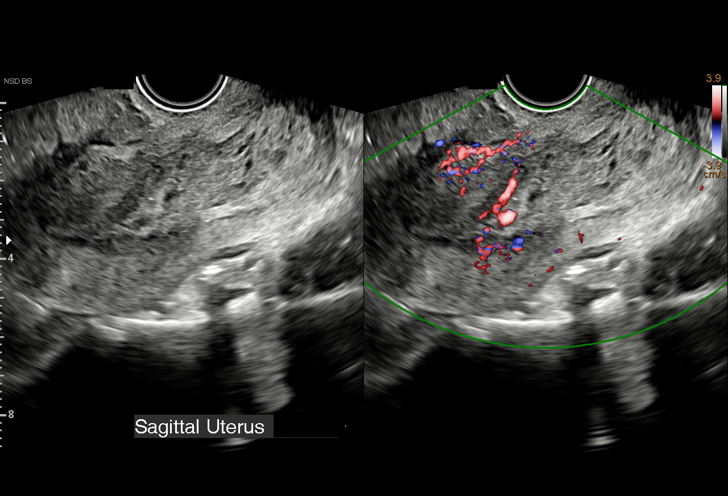
[im 7/32]
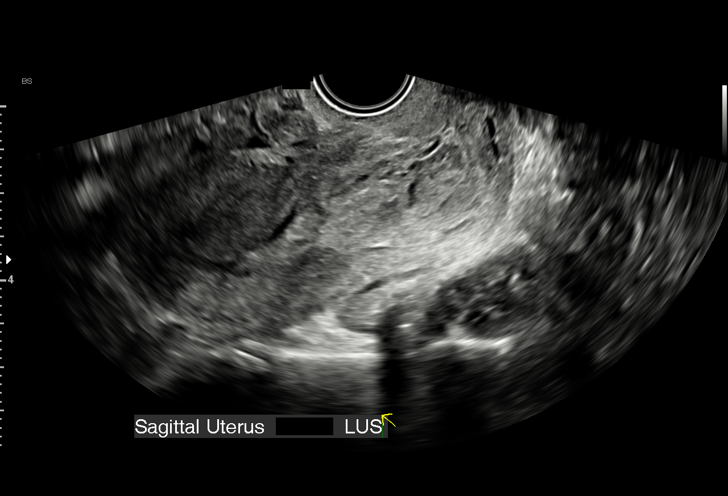
[im 10/32]
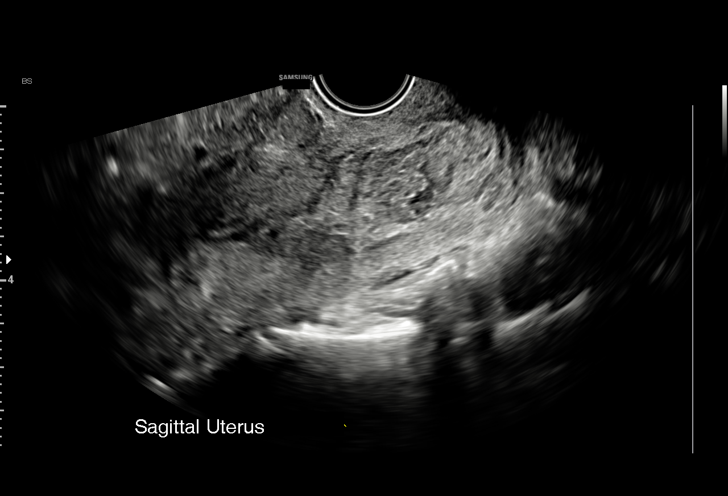
[im 12/32]
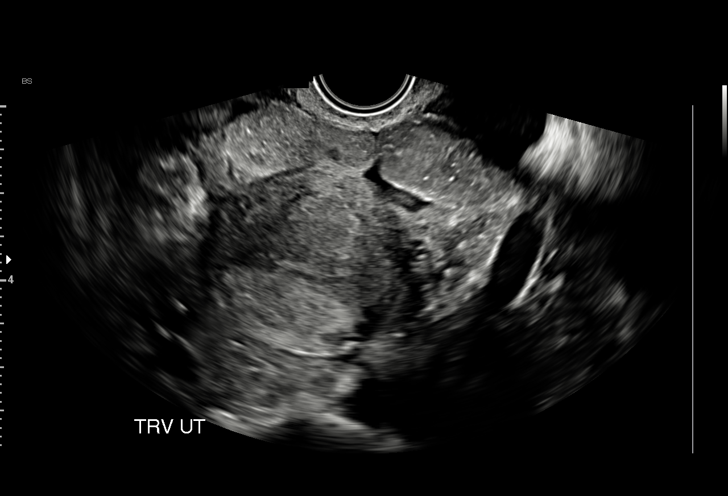
[im 14/32]
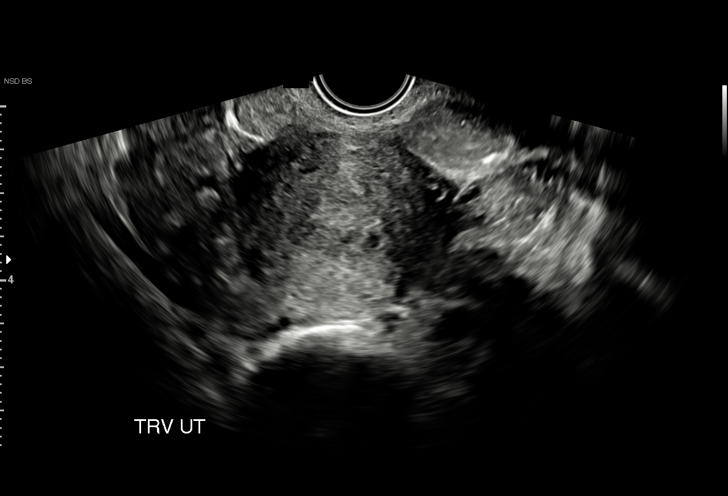
[im 17/32]
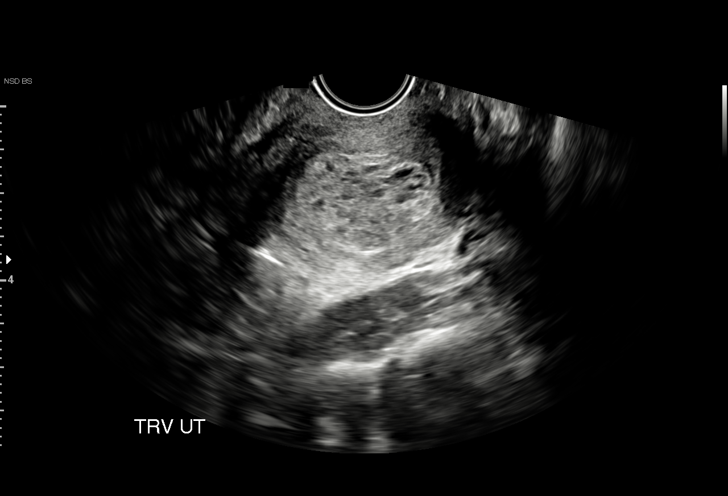
[im 18/32]
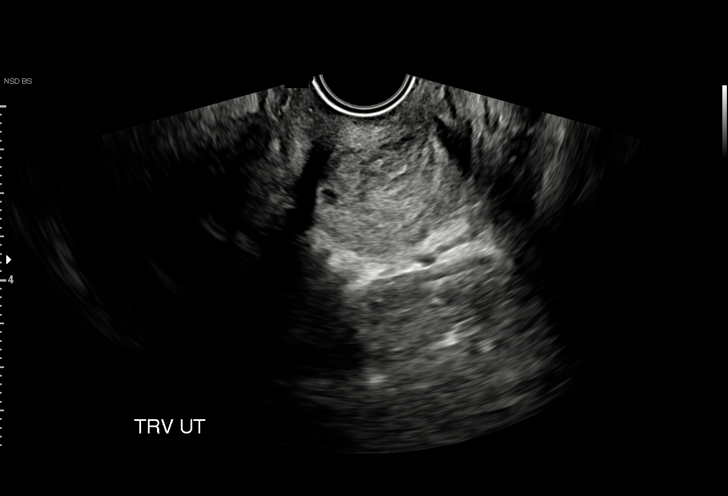
[im 20/32]
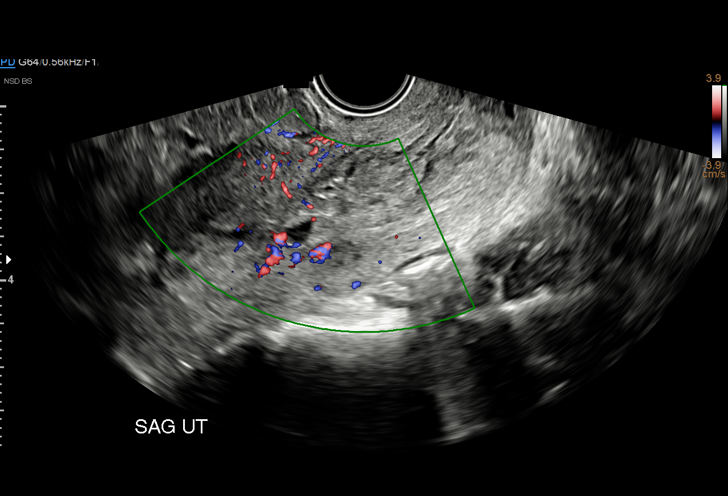
[im 22/32]
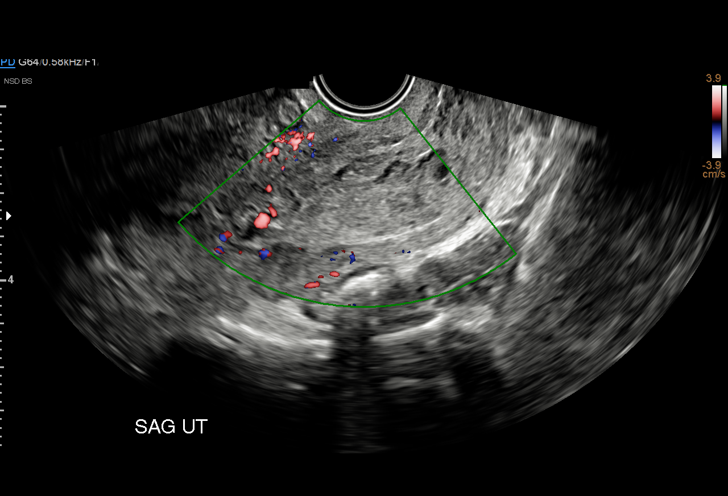
[im 25/32]
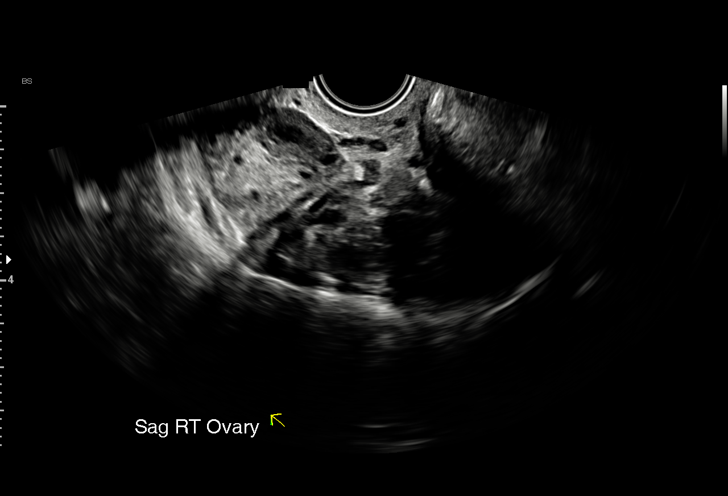
[im 27/32]
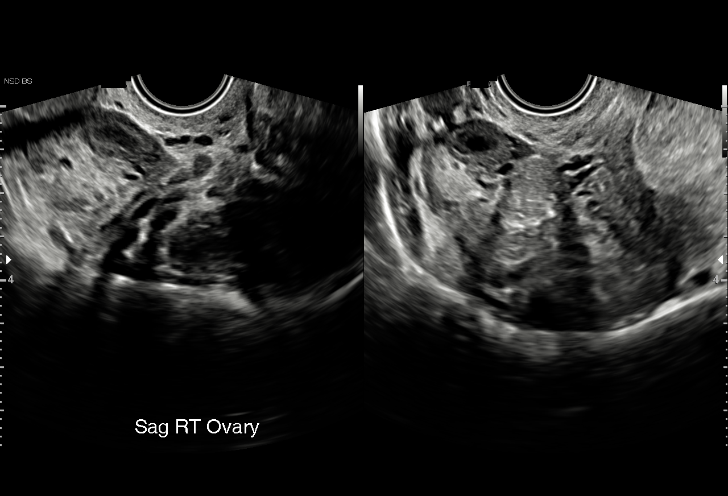
[im 29/32]
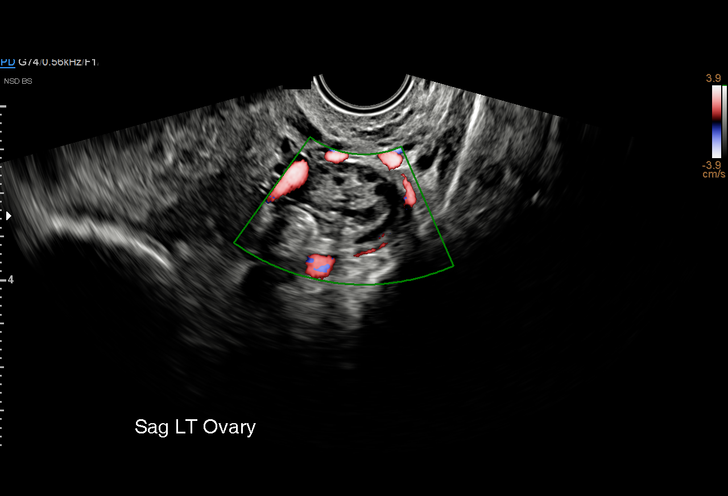
[im 32/32]
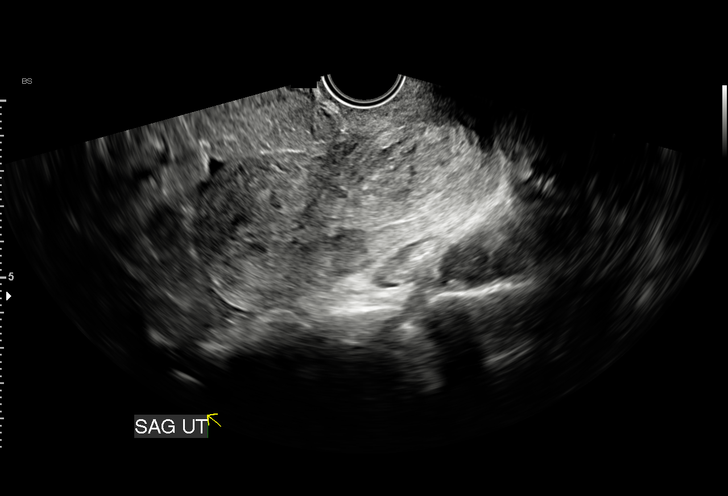

[15 of 28 positions shown; findings below may reference images not displayed]

FINDINGS: Intrauterine gestational sac: None

Yolk sac:  Not Visualized.

Embryo:  Not Visualized.

Cardiac Activity: Not Visualized.

Heart Rate: N/A  bpm

Subchorionic hemorrhage:  None visualized.

Maternal uterus/adnexae:

The endometrium within the lower uterine segment is thickened (2.7
cm) and heterogeneous in appearance. This is stable in appearance
when compared to the prior study.

The right and left ovaries are visualized and are normal in
appearance.
IMPRESSION: 1. Stable exam without evidence of an intrauterine or ectopic
pregnancy.

## 2021-11-13 LAB — OB RESULTS CONSOLE HEPATITIS B SURFACE ANTIGEN: Hepatitis B Surface Ag: NEGATIVE

## 2021-11-13 LAB — HEPATITIS C ANTIBODY: HCV Ab: NEGATIVE

## 2021-11-13 LAB — OB RESULTS CONSOLE RUBELLA ANTIBODY, IGM: Rubella: IMMUNE

## 2021-11-13 LAB — OB RESULTS CONSOLE ABO/RH: RH Type: POSITIVE

## 2021-11-13 LAB — OB RESULTS CONSOLE HIV ANTIBODY (ROUTINE TESTING): HIV: NONREACTIVE

## 2021-11-13 LAB — OB RESULTS CONSOLE ANTIBODY SCREEN: Antibody Screen: NEGATIVE

## 2021-11-19 LAB — OB RESULTS CONSOLE GC/CHLAMYDIA
Chlamydia: NEGATIVE
Neisseria Gonorrhea: NEGATIVE

## 2022-01-26 NOTE — L&D Delivery Note (Signed)
Delivery Note Arrived to room to evaluate for cervical change. Patient was found to be complete and +1 station. Forebag ruptured. Practice pushes revealed good maternal effort. Bed was broken down and pushing re-initiated. Fetal head was delivered. No nuchal was present. Shoulders and body followed without difficulty. Infant was placed on mother's abdomen, mouth and nose were bulb suctioned and let out a vigorous cry. Delayed cord clamping was performed for 60 seconds. Cord clamped and cut by FOB. Venous cord blood was collected. Placenta delivered spontaneously. Cervix, vagina, perineum and labia were inspected and a second degree perineal laceration and left periurethral laceration were noted. The second degree perineal laceration was repaired using 3-0 Vicryl and the left periurethral laceration was repaired using 4-0 Vicryl on an SH needle. Uterus fundus firm. TXA 1g IV x 1 was given as prophylaxis. Placenta was inspected, found to be intact with a 3 vessel cord. Counts were correct.   At 8:55 PM a viable and healthy female was delivered via Vaginal, Spontaneous (Presentation: Right Occiput Anterior).  APGAR: 9, 9; weight: Pending. Placenta status: Spontaneous, Intact.  Cord: 3 vessels with the following complications: None.  Cord pH: Not collected.  Anesthesia: Epidural Episiotomy:  N /A Lacerations: 2nd degree Suture Repair: 3.0 vicryl and 4.0 Vicryl Est. Blood Loss (mL):  201  Mom to postpartum.  Baby to Couplet care / Skin to Skin.  Steva Ready 05/14/2022, 9:24 PM

## 2022-04-16 LAB — OB RESULTS CONSOLE GBS: GBS: NEGATIVE

## 2022-05-01 ENCOUNTER — Other Ambulatory Visit: Payer: Self-pay | Admitting: Obstetrics and Gynecology

## 2022-05-11 ENCOUNTER — Telehealth (HOSPITAL_COMMUNITY): Payer: Self-pay | Admitting: *Deleted

## 2022-05-11 ENCOUNTER — Encounter (HOSPITAL_COMMUNITY): Payer: Self-pay

## 2022-05-11 ENCOUNTER — Encounter (HOSPITAL_COMMUNITY): Payer: Self-pay | Admitting: *Deleted

## 2022-05-11 NOTE — Telephone Encounter (Signed)
Preadmission screen  

## 2022-05-12 ENCOUNTER — Encounter (HOSPITAL_COMMUNITY): Payer: Self-pay | Admitting: *Deleted

## 2022-05-12 ENCOUNTER — Telehealth (HOSPITAL_COMMUNITY): Payer: Self-pay | Admitting: *Deleted

## 2022-05-12 NOTE — Telephone Encounter (Signed)
Preadmission screen  

## 2022-05-13 ENCOUNTER — Other Ambulatory Visit: Payer: Self-pay

## 2022-05-13 ENCOUNTER — Inpatient Hospital Stay (HOSPITAL_COMMUNITY): Admission: AD | Admit: 2022-05-13 | Payer: BC Managed Care – PPO | Source: Home / Self Care

## 2022-05-13 ENCOUNTER — Inpatient Hospital Stay (HOSPITAL_COMMUNITY)
Admission: AD | Admit: 2022-05-13 | Discharge: 2022-05-16 | DRG: 807 | Disposition: A | Discharge status: Home / Self Care | Payer: BC Managed Care – PPO | Attending: Obstetrics and Gynecology | Admitting: Obstetrics and Gynecology

## 2022-05-13 ENCOUNTER — Encounter (HOSPITAL_COMMUNITY): Payer: Self-pay | Admitting: Obstetrics & Gynecology

## 2022-05-13 DIAGNOSIS — Z23 Encounter for immunization: Secondary | ICD-10-CM

## 2022-05-13 DIAGNOSIS — O139 Gestational [pregnancy-induced] hypertension without significant proteinuria, unspecified trimester: Secondary | ICD-10-CM | POA: Insufficient documentation

## 2022-05-13 DIAGNOSIS — O480 Post-term pregnancy: Secondary | ICD-10-CM | POA: Diagnosis present

## 2022-05-13 DIAGNOSIS — O134 Gestational [pregnancy-induced] hypertension without significant proteinuria, complicating childbirth: Secondary | ICD-10-CM | POA: Diagnosis present

## 2022-05-13 DIAGNOSIS — O4202 Full-term premature rupture of membranes, onset of labor within 24 hours of rupture: Principal | ICD-10-CM | POA: Diagnosis present

## 2022-05-13 DIAGNOSIS — Z3A40 40 weeks gestation of pregnancy: Secondary | ICD-10-CM

## 2022-05-13 DIAGNOSIS — Z87891 Personal history of nicotine dependence: Secondary | ICD-10-CM | POA: Diagnosis not present

## 2022-05-13 DIAGNOSIS — O26893 Other specified pregnancy related conditions, third trimester: Secondary | ICD-10-CM | POA: Diagnosis present

## 2022-05-13 LAB — COMPREHENSIVE METABOLIC PANEL
ALT: 11 U/L (ref 0–44)
AST: 18 U/L (ref 15–41)
Albumin: 2.8 g/dL — ABNORMAL LOW (ref 3.5–5.0)
Alkaline Phosphatase: 397 U/L — ABNORMAL HIGH (ref 38–126)
Anion gap: 10 (ref 5–15)
BUN: <5 mg/dL — ABNORMAL LOW (ref 6–20)
CO2: 18 mmol/L — ABNORMAL LOW (ref 22–32)
Calcium: 9 mg/dL (ref 8.9–10.3)
Chloride: 107 mmol/L (ref 98–111)
Creatinine, Ser: 0.57 mg/dL (ref 0.44–1.00)
GFR, Estimated: >60 mL/min (ref >60)
Glucose, Bld: 86 mg/dL (ref 70–99)
Potassium: 3.4 mmol/L — ABNORMAL LOW (ref 3.5–5.1)
Sodium: 135 mmol/L (ref 135–145)
Total Bilirubin: 0.4 mg/dL (ref 0.3–1.2)
Total Protein: 6.2 g/dL — ABNORMAL LOW (ref 6.5–8.1)

## 2022-05-13 LAB — CBC
HCT: 34.4 % — ABNORMAL LOW (ref 36.0–46.0)
Hemoglobin: 11.5 g/dL — ABNORMAL LOW (ref 12.0–15.0)
MCH: 31.6 pg (ref 26.0–34.0)
MCHC: 33.4 g/dL (ref 30.0–36.0)
MCV: 94.5 fL (ref 80.0–100.0)
Platelets: 166 10*3/uL (ref 150–400)
RBC: 3.64 MIL/uL — ABNORMAL LOW (ref 3.87–5.11)
RDW: 13.2 % (ref 11.5–15.5)
WBC: 6.4 10*3/uL (ref 4.0–10.5)
nRBC: 0 % (ref 0.0–0.2)

## 2022-05-13 LAB — CBC WITH DIFFERENTIAL/PLATELET
Abs Immature Granulocytes: 0.03 10*3/uL (ref 0.00–0.07)
Basophils Absolute: 0 10*3/uL (ref 0.0–0.1)
Basophils Relative: 1 %
Eosinophils Absolute: 0 10*3/uL (ref 0.0–0.5)
Eosinophils Relative: 1 %
HCT: 34.5 % — ABNORMAL LOW (ref 36.0–46.0)
Hemoglobin: 12.1 g/dL (ref 12.0–15.0)
Immature Granulocytes: 1 %
Lymphocytes Relative: 22 %
Lymphs Abs: 1.4 10*3/uL (ref 0.7–4.0)
MCH: 32 pg (ref 26.0–34.0)
MCHC: 35.1 g/dL (ref 30.0–36.0)
MCV: 91.3 fL (ref 80.0–100.0)
Monocytes Absolute: 0.8 10*3/uL (ref 0.1–1.0)
Monocytes Relative: 13 %
Neutro Abs: 3.8 10*3/uL (ref 1.7–7.7)
Neutrophils Relative %: 62 %
Platelets: 170 10*3/uL (ref 150–400)
RBC: 3.78 MIL/uL — ABNORMAL LOW (ref 3.87–5.11)
RDW: 13.2 % (ref 11.5–15.5)
WBC: 6.1 10*3/uL (ref 4.0–10.5)
nRBC: 0 % (ref 0.0–0.2)

## 2022-05-13 LAB — PROTEIN / CREATININE RATIO, URINE
Creatinine, Urine: 84 mg/dL
Protein Creatinine Ratio: 0.11 mg/mg{Cre} (ref 0.00–0.15)
Total Protein, Urine: 9 mg/dL

## 2022-05-13 LAB — TYPE AND SCREEN
ABO/RH(D): A POS
Antibody Screen: NEGATIVE

## 2022-05-13 MED ORDER — LACTATED RINGERS IV SOLN: INTRAVENOUS | Status: DC

## 2022-05-13 MED ORDER — FLEET ENEMA 7-19 GM/118ML RE ENEM
1.0000 | ENEMA | RECTAL | Status: DC | PRN
Start: 2022-05-13 — End: 2022-05-13

## 2022-05-13 MED ORDER — LABETALOL HCL 5 MG/ML IV SOLN
20.0000 mg | INTRAVENOUS | Status: DC | PRN
  Administered 2022-05-14: 20 mg via INTRAVENOUS
  Filled 2022-05-13: qty 4

## 2022-05-13 MED ORDER — LIDOCAINE HCL (PF) 1 % IJ SOLN
30.0000 mL | INTRAMUSCULAR | Status: DC | PRN
Start: 2022-05-13 — End: 2022-05-13

## 2022-05-13 MED ORDER — LABETALOL HCL 5 MG/ML IV SOLN: 80.0000 mg | INTRAVENOUS | Status: DC | PRN

## 2022-05-13 MED ORDER — FLEET ENEMA 7-19 GM/118ML RE ENEM: 1.0000 | ENEMA | RECTAL | Status: DC | PRN

## 2022-05-13 MED ORDER — ACETAMINOPHEN 325 MG PO TABS: 650.0000 mg | ORAL_TABLET | ORAL | Status: DC | PRN

## 2022-05-13 MED ORDER — HYDRALAZINE HCL 20 MG/ML IJ SOLN: 10.0000 mg | INTRAMUSCULAR | Status: DC | PRN

## 2022-05-13 MED ORDER — ACETAMINOPHEN 325 MG PO TABS
650.0000 mg | ORAL_TABLET | ORAL | Status: DC | PRN
Start: 2022-05-13 — End: 2022-05-13

## 2022-05-13 MED ORDER — SOD CITRATE-CITRIC ACID 500-334 MG/5ML PO SOLN: 30.0000 mL | ORAL | Status: DC | PRN

## 2022-05-13 MED ORDER — ONDANSETRON HCL 4 MG/2ML IJ SOLN: 4.0000 mg | Freq: Four times a day (QID) | INTRAMUSCULAR | Status: DC | PRN

## 2022-05-13 MED ORDER — OXYTOCIN BOLUS FROM INFUSION
333.0000 mL | Freq: Once | INTRAVENOUS | Status: DC
Start: 2022-05-13 — End: 2022-05-13

## 2022-05-13 MED ORDER — LABETALOL HCL 5 MG/ML IV SOLN: 40.0000 mg | INTRAVENOUS | Status: DC | PRN

## 2022-05-13 MED ORDER — OXYTOCIN BOLUS FROM INFUSION
333.0000 mL | Freq: Once | INTRAVENOUS | Status: AC
  Administered 2022-05-14: 333 mL via INTRAVENOUS

## 2022-05-13 MED ORDER — OXYTOCIN-SODIUM CHLORIDE 30-0.9 UT/500ML-% IV SOLN: 2.5000 [IU]/h | INTRAVENOUS | Status: DC

## 2022-05-13 MED ORDER — FENTANYL CITRATE (PF) 100 MCG/2ML IJ SOLN
100.0000 ug | INTRAMUSCULAR | Status: DC | PRN
  Administered 2022-05-14 (×3): 100 ug via INTRAVENOUS
  Filled 2022-05-13 (×3): qty 2

## 2022-05-13 MED ORDER — LIDOCAINE HCL (PF) 1 % IJ SOLN: 30.0000 mL | INTRAMUSCULAR | Status: DC | PRN

## 2022-05-13 MED ORDER — TERBUTALINE SULFATE 1 MG/ML IJ SOLN: 0.2500 mg | Freq: Once | INTRAMUSCULAR | Status: DC | PRN

## 2022-05-13 MED ORDER — OXYTOCIN-SODIUM CHLORIDE 30-0.9 UT/500ML-% IV SOLN
2.5000 [IU]/h | INTRAVENOUS | Status: DC
  Filled 2022-05-13: qty 500

## 2022-05-13 MED ORDER — MISOPROSTOL 50MCG HALF TABLET
50.0000 ug | ORAL_TABLET | ORAL | Status: DC | PRN
  Administered 2022-05-13 – 2022-05-14 (×4): 50 ug via BUCCAL
  Filled 2022-05-13 (×4): qty 1

## 2022-05-13 MED ORDER — LACTATED RINGERS IV SOLN: 500.0000 mL | INTRAVENOUS | Status: DC | PRN

## 2022-05-13 NOTE — MAU Note (Signed)
Mariah Sosa is a 27 y.o. at [redacted]w[redacted]d here in MAU reporting: she began having LOF this morning @ [phone], reports fluid is clear.  Denies VB or ctxs.  Endorses +FM. LMP: NA Onset of complaint: today Pain score: 0 Vitals:   05/13/22 1129  BP: (!) 139/100  Pulse: 95  Resp: 20  Temp: 98 F (36.7 C)  SpO2: 98%     FHT:130 bpm Lab orders placed from triage:   UA

## 2022-05-13 NOTE — H&P (Addendum)
Mariah Sosa is a 27 y.o. female presenting for leakage of fluid from 8.20 am, clear fluid. She was evaluated in the MAU and found to be grossly ruptured and with fern test positive. [id number] at [redacted] weeks EGA, with EDC of 05/13/22 by first trimester U/S.  Prenatal  course has been benign and prenatal care received from [facility].   OB History     Gravida  3   Para  0   Term  0   Preterm  0   AB  2   Living  0      SAB  1   IAB  0   Ectopic  0   Multiple  0   Live Births  0          Past Medical History:  Diagnosis Date   Headache(784.0)    Trichimoniasis 12/18/2019  Left breasts cysts, for 6 month left breast follow up after delivery.   Past Surgical History:  Procedure Laterality Date   DILATION AND EVACUATION N/A 12/24/2019   Procedure: DILATATION AND EVACUATION;  Surgeon: Reva Bores, MD;  Location: [facility] OR;  Service: Gynecology;  Laterality: N/A;   NO PAST SURGERIES     Family History: family history includes Heart failure in her paternal grandmother. Social History:  reports that she quit smoking about 8 months ago. Her smoking use included cigars. She has never used smokeless tobacco. She reports that she does not drink alcohol and does not use drugs.     Maternal Diabetes: No Genetic Screening: Normal Maternal Ultrasounds/Referrals: Normal Fetal Ultrasounds or other Referrals:  None Maternal Substance Abuse:  No Significant Maternal Medications:  None Significant Maternal Lab Results:  Group B Strep negative Number of Prenatal Visits:greater than 3 verified prenatal visits Other Comments:  None  Review of Systems Constitutional: Denies fevers/chills Cardiovascular: Denies chest pain or palpitations Pulmonary: Denies coughing or wheezing Gastrointestinal: Denies nausea, vomiting or diarrhea Genitourinary: Denies pelvic pain, unusual vaginal bleeding, unusual vaginal discharge, dysuria, urgency or frequency.  Musculoskeletal:  Denies muscle or joint aches and pain.  Neurology: Denies abnormal sensations such as tingling or numbness.   Current Outpatient Medications  Medication Instructions   ibuprofen (ADVIL) 600 mg, Oral, Every 6 hours PRN   misoprostol (CYTOTEC) 200 MCG tablet Place two tablets in the vagina and take 2 by mouth   Prenatal Vit-Fe Fumarate-FA (MULTIVITAMIN-PRENATAL) 27-0.8 MG TABS tablet 1 tablet, Oral, Daily     No Known Allergies    History Dilation: Fingertip Exam by:: S. Payne Blood pressure (!) 140/104, pulse 81, temperature 98 F (36.7 C), temperature source Oral, resp. rate 20, height  (1.549 m), weight 70.6 kg, SpO2 98 %, unknown if currently breastfeeding. Exam Physical Exam  Constitutional: She is oriented to person, place, and time. She appears well-developed and well-nourished.  HENT:  Head: Normocephalic and atraumatic.  Neck: Normal range of motion.  Cardiovascular: Normal rate.    Respiratory: Effort normal.   GI: Soft.  Skin: Skin is warm and dry.  Psychiatric: She has a normal mood and affect. Her behavior is normal.   Genitourinary: Gravid uterus, appropriate for gestational age.   EFW by Leopolds 7.5 lbs. Cephalic presentation by leopolds.  EFM: 130 BL, moderate variability, reactive. No decelerations.  TOCO: With irregular contractions about every 10 minutes.   Prenatal labs: ABO, Rh: A/Positive/-- (10/19 0000) Antibody: Negative (10/19 0000) Rubella: Immune (10/19 0000) RPR:  NR  HBsAg: Negative (10/19 0000)  HIV: Non-reactive (10/19  0000)  GBS: Negative/-- (03/21 0000)   Recent Results (from the past 2160 hour(s))  OB RESULT CONSOLE Group B Strep     Status: None   Collection Time: 04/16/22 12:00 AM  Result Value Ref Range   GBS Negative   Protein / creatinine ratio, urine     Status: None   Collection Time: 05/13/22 11:51 AM  Result Value Ref Range   Creatinine, Urine 84 mg/dL   Total Protein, Urine 9 mg/dL    Comment: NO NORMAL RANGE  ESTABLISHED FOR THIS TEST   Protein Creatinine Ratio 0.11 0.00 - 0.15 mg/mg[Cre]    Comment: Performed at [facility], [address] [place], [place] [address]  Type and screen     Status: None   Collection Time: 05/13/22 11:57 AM  Result Value Ref Range   ABO/RH(D) A POS    Antibody Screen NEG    Sample Expiration      05/16/2022,2359 Performed at [facility], [address], [place], [place] [address]   CBC with Differential/Platelet     Status: Abnormal   Collection Time: 05/13/22 12:00 PM  Result Value Ref Range   WBC 6.1 4.0 - 10.5 K/uL   RBC 3.78 (L) 3.87 - 5.11 MIL/uL   Hemoglobin 12.1 12.0 - 15.0 g/dL   HCT 72.5 (L) 36.6 - 44.0 %   MCV 91.3 80.0 - 100.0 fL   MCH 32.0 26.0 - 34.0 pg   MCHC 35.1 30.0 - 36.0 g/dL   RDW 34.7 42.5 - 95.6 %   Platelets 170 150 - 400 K/uL   nRBC 0.0 0.0 - 0.2 %   Neutrophils Relative % 62 %   Neutro Abs 3.8 1.7 - 7.7 K/uL   Lymphocytes Relative 22 %   Lymphs Abs 1.4 0.7 - 4.0 K/uL   Monocytes Relative 13 %   Monocytes Absolute 0.8 0.1 - 1.0 K/uL   Eosinophils Relative 1 %   Eosinophils Absolute 0.0 0.0 - 0.5 K/uL   Basophils Relative 1 %   Basophils Absolute 0.0 0.0 - 0.1 K/uL   Immature Granulocytes 1 %   Abs Immature Granulocytes 0.03 0.00 - 0.07 K/uL    Comment: Performed at [facility], [address] [place], [place] [address]  Comprehensive metabolic panel     Status: Abnormal   Collection Time: 05/13/22 12:00 PM  Result Value Ref Range   Sodium 135 135 - 145 mmol/L   Potassium 3.4 (L) 3.5 - 5.1 mmol/L   Chloride 107 98 - 111 mmol/L   CO2 18 (L) 22 - 32 mmol/L   Glucose, Bld 86 70 - 99 mg/dL    Comment: Glucose reference range applies only to samples taken after fasting for at least 8 hours.   BUN <5 (L) 6 - 20 mg/dL   Creatinine, Ser 4.33 0.44 - 1.00 mg/dL   Calcium 9.0 8.9 - 29.5 mg/dL   Total Protein 6.2 (L) 6.5 - 8.1 g/dL   Albumin 2.8 (L) 3.5 - 5.0 g/dL   AST 18 15 - 41 U/L   ALT 11 0 - 44 U/L    Alkaline Phosphatase 397 (H) 38 - 126 U/L   Total Bilirubin 0.4 0.3 - 1.2 mg/dL   GFR, Estimated >18 >84 mL/min    Comment: (NOTE) Calculated using the CKD-EPI Creatinine Equation (2021)    Anion gap 10 5 - 15    Comment: Performed at [facility], [address], [place],  [place] [address]   Assessment/Plan: 27 y/o G3P0020 at [redacted] weeks EGA with premature rupture of membranes, Admit to [facility] Labor and Delivery as per admit orders. Discussed will reevaluate cervix/contraction pattern in a few hours and if unchanging then will proceed with labor augmentation and patient agrees.  With mild range elevated blood pressures and patient rules in for gestational HTN, continue with close monitoring of blood pressure and for signs and symptoms of preeclampsia.  Prescilla Sours, MD.  05/13/2022, 1:15 PM

## 2022-05-13 NOTE — Plan of Care (Signed)
  Problem: Education: Goal: Knowledge of Childbirth will improve Outcome: Progressing Goal: Ability to make informed decisions regarding treatment and plan of care will improve Outcome: Progressing Goal: Ability to state and carry out methods to decrease the pain will improve Outcome: Progressing Goal: Individualized Educational Video(s) Outcome: Progressing   Problem: Coping: Goal: Ability to verbalize concerns and feelings about labor and delivery will improve Outcome: Progressing   Problem: Life Cycle: Goal: Ability to make normal progression through stages of labor will improve Outcome: Progressing Goal: Ability to effectively push during vaginal delivery will improve Outcome: Progressing   Problem: Role Relationship: Goal: Will demonstrate positive interactions with the child Outcome: Progressing   Problem: Safety: Goal: Risk of complications during labor and delivery will decrease Outcome: Progressing   Problem: Pain Management: Goal: Relief or control of pain from uterine contractions will improve Outcome: Progressing   Problem: Education: Goal: Knowledge of General Education information will improve Description: Including pain rating scale, medication(s)/side effects and non-pharmacologic comfort measures Outcome: Progressing   Problem: Health Behavior/Discharge Planning: Goal: Ability to manage health-related needs will improve Outcome: Progressing   Problem: Clinical Measurements: Goal: Ability to maintain clinical measurements within normal limits will improve Outcome: Progressing Goal: Will remain free from infection Outcome: Progressing Goal: Diagnostic test results will improve Outcome: Progressing Goal: Respiratory complications will improve Outcome: Progressing Goal: Cardiovascular complication will be avoided Outcome: Progressing   Problem: Activity: Goal: Risk for activity intolerance will decrease Outcome: Progressing   Problem:  Nutrition: Goal: Adequate nutrition will be maintained Outcome: Progressing   Problem: Coping: Goal: Level of anxiety will decrease Outcome: Progressing   Problem: Elimination: Goal: Will not experience complications related to bowel motility Outcome: Progressing Goal: Will not experience complications related to urinary retention Outcome: Progressing   Problem: Pain Managment: Goal: General experience of comfort will improve Outcome: Progressing   Problem: Safety: Goal: Ability to remain free from injury will improve Outcome: Progressing   Problem: Skin Integrity: Goal: Risk for impaired skin integrity will decrease Outcome: Progressing   Problem: Education: Goal: Knowledge of disease or condition will improve Outcome: Progressing Goal: Knowledge of the prescribed therapeutic regimen will improve Outcome: Progressing   Problem: Fluid Volume: Goal: Peripheral tissue perfusion will improve Outcome: Progressing   Problem: Clinical Measurements: Goal: Complications related to disease process, condition or treatment will be avoided or minimized Outcome: Progressing   

## 2022-05-13 NOTE — Progress Notes (Signed)
FHR not tracing from 1503-1509 secondary pt position.  Pt then unplugged EFM to use bathroom.

## 2022-05-13 NOTE — Progress Notes (Addendum)
Mariah Sosa is a 27 y.o. G3P0020 at [redacted]w[redacted]d admitted for premature rupture of membranes   Subjective: Patient continues with leakage of fluid, denies contractions.   Objective: BP (!) 132/103   Pulse 80   Temp 98.2 F (36.8 C) (Oral)   Resp 18   Ht  (1.549 m)   Wt 70.6 kg   SpO2 98%   BMI 29.40 kg/m  No intake/output data recorded. No intake/output data recorded.    05/13/2022    6:30 PM 05/13/2022    5:35 PM 05/13/2022    4:20 PM  Vitals with BMI  Systolic 132 144 161  Diastolic 103 97 98  Pulse 80 86 84    FHT:  FHR: 130 bpm, variability: moderate,  accelerations:  Present,  decelerations:  Absent UC:   irregular, every 5 to 6  minutes SVE:   Dilation: Fingertip Exam by:: Rhunette Croft  Labs: Lab Results  Component Value Date   WBC 6.4 05/13/2022   HGB 11.5 (L) 05/13/2022   HCT 34.4 (L) 05/13/2022   MCV 94.5 05/13/2022   PLT 166 05/13/2022    Assessment / Plan: 27 year old G3P0020 at [redacted] weeks EGA with Premature rupture of membranes,  Labor:  Patient desires labor augmentation.  She is s/p buccal cytotec x 1, for reassessment at 9.36 pm. Preeclampsia:   With gestational HTN, c/w close blood pressure monitoring and for IV labetalol protocol if with severe range blood pressures.  Fetal Wellbeing:  Category I Pain Control:  Labor support without medications but may receive pain medication as desired.  I/D:   GBS negative.  Anticipated MOD:  NSVD  Prescilla Sours, MD 05/13/2022, 7:14 PM

## 2022-05-13 NOTE — Progress Notes (Signed)
   Subjective: In to introduce myself to Mariah Sosa. She does not feel her contractions. Recently received second dose of buccal cytotec. Positive fetal movement. No vaginal bleeding. Continued LOF   Objective: BP (!) 138/98 (BP Location: Left Arm)   Pulse 69   Temp 98.6 F (37 C) (Oral)   Resp 18   Ht  (1.549 m)   Wt 70.6 kg   SpO2 98%   BMI 29.40 kg/m  No intake/output data recorded. No intake/output data recorded.  FHT:  FHR: 125 bpm, variability: moderate,  accelerations:  Present,  decelerations:  Absent UC:   irregular, every 4-7 minutes SVE:   Dilation: Fingertip Effacement (%): Thick Station: -2 Exam by:: Ralene Bathe RN  Labs: Lab Results  Component Value Date   WBC 6.4 05/13/2022   HGB 11.5 (L) 05/13/2022   HCT 34.4 (L) 05/13/2022   MCV 94.5 05/13/2022   PLT 166 05/13/2022    Assessment / Plan: Induction of labor due to PROM  Labor:  continue cervical ripening with buccal cytotec  Preeclampsia:  labs stable and NA Fetal Wellbeing:  Category I Pain Control:  Labor support without medications I/D:  n/a Anticipated MOD:  NSVD  Gerald Leitz, MD 05/13/2022, 10:49 PM

## 2022-05-13 NOTE — MAU Provider Note (Signed)
Event Date/Time   First Provider Initiated Contact with Patient 05/13/22 1256       S: Ms. Mariah Sosa is a 27 y.o. G3P0020 at [redacted]w[redacted]d  who presents to MAU today complaining of leaking of fluid since 0823 this morning. She denies vaginal bleeding. She denies contractions. She reports normal fetal movement.    O: BP (!) 140/104 (BP Location: Right Arm)   Pulse 81   Temp 98 F (36.7 C) (Oral)   Resp 20   Ht  (1.549 m)   Wt 70.6 kg   SpO2 98%   BMI 29.40 kg/m  GENERAL: Well-developed, well-nourished female in no acute distress.  HEAD: Normocephalic, atraumatic.  CHEST: Normal effort of breathing, regular heart rate ABDOMEN: Soft, nontender, gravid PELVIC: Normal external female genitalia. Vagina is pink and rugated. Cervix with normal contour, no lesions. Normal discharge.  Negative for overt pooling, but with coughing fluid noted coming from cervix.   Cervical exam:  Dilation: Fingertip Exam by:: Rhunette Croft   Fetal Monitoring: Baseline: 135bpm  Variability:  moderate  Accelerations: present  Decelerations: absent  Contractions: Irregular     Results for orders placed or performed during the hospital encounter of 05/13/22 (from the past 24 hour(s))  CBC with Differential/Platelet     Status: Abnormal   Collection Time: 05/13/22 12:00 PM  Result Value Ref Range   WBC 6.1 4.0 - 10.5 K/uL   RBC 3.78 (L) 3.87 - 5.11 MIL/uL   Hemoglobin 12.1 12.0 - 15.0 g/dL   HCT 16.1 (L) 09.6 - 04.5 %   MCV 91.3 80.0 - 100.0 fL   MCH 32.0 26.0 - 34.0 pg   MCHC 35.1 30.0 - 36.0 g/dL   RDW 40.9 81.1 - 91.4 %   Platelets 170 150 - 400 K/uL   nRBC 0.0 0.0 - 0.2 %   Neutrophils Relative % 62 %   Neutro Abs 3.8 1.7 - 7.7 K/uL   Lymphocytes Relative 22 %   Lymphs Abs 1.4 0.7 - 4.0 K/uL   Monocytes Relative 13 %   Monocytes Absolute 0.8 0.1 - 1.0 K/uL   Eosinophils Relative 1 %   Eosinophils Absolute 0.0 0.0 - 0.5 K/uL   Basophils Relative 1 %   Basophils Absolute 0.0 0.0 - 0.1  K/uL   Immature Granulocytes 1 %   Abs Immature Granulocytes 0.03 0.00 - 0.07 K/uL  Comprehensive metabolic panel     Status: Abnormal   Collection Time: 05/13/22 12:00 PM  Result Value Ref Range   Sodium 135 135 - 145 mmol/L   Potassium 3.4 (L) 3.5 - 5.1 mmol/L   Chloride 107 98 - 111 mmol/L   CO2 18 (L) 22 - 32 mmol/L   Glucose, Bld 86 70 - 99 mg/dL   BUN <5 (L) 6 - 20 mg/dL   Creatinine, Ser 7.82 0.44 - 1.00 mg/dL   Calcium 9.0 8.9 - 95.6 mg/dL   Total Protein 6.2 (L) 6.5 - 8.1 g/dL   Albumin 2.8 (L) 3.5 - 5.0 g/dL   AST 18 15 - 41 U/L   ALT 11 0 - 44 U/L   Alkaline Phosphatase 397 (H) 38 - 126 U/L   Total Bilirubin 0.4 0.3 - 1.2 mg/dL   GFR, Estimated >21 >30 mL/min   Anion gap 10 5 - 15     A: [facility] at [redacted]w[redacted]d  SROM Fern Positive  - Admit for SROM   P: Admit to L&D   Carlynn Herald, CNM 05/13/2022  12:56 PM

## 2022-05-14 ENCOUNTER — Inpatient Hospital Stay (HOSPITAL_COMMUNITY): Payer: BC Managed Care – PPO | Admitting: Anesthesiology

## 2022-05-14 ENCOUNTER — Encounter (HOSPITAL_COMMUNITY): Payer: Self-pay | Admitting: Obstetrics and Gynecology

## 2022-05-14 LAB — CBC
HCT: 32.5 % — ABNORMAL LOW (ref 36.0–46.0)
Hemoglobin: 11.4 g/dL — ABNORMAL LOW (ref 12.0–15.0)
MCH: 32.4 pg (ref 26.0–34.0)
MCHC: 35.1 g/dL (ref 30.0–36.0)
MCV: 92.3 fL (ref 80.0–100.0)
Platelets: 151 10*3/uL (ref 150–400)
RBC: 3.52 MIL/uL — ABNORMAL LOW (ref 3.87–5.11)
RDW: 13.3 % (ref 11.5–15.5)
WBC: 12.3 10*3/uL — ABNORMAL HIGH (ref 4.0–10.5)
nRBC: 0 % (ref 0.0–0.2)

## 2022-05-14 LAB — RPR: RPR Ser Ql: NONREACTIVE

## 2022-05-14 MED ORDER — ACETAMINOPHEN 325 MG PO TABS: 650.0000 mg | ORAL_TABLET | ORAL | Status: DC | PRN

## 2022-05-14 MED ORDER — TERBUTALINE SULFATE 1 MG/ML IJ SOLN: 0.2500 mg | Freq: Once | INTRAMUSCULAR | Status: DC | PRN

## 2022-05-14 MED ORDER — SODIUM CHLORIDE 0.9% FLUSH: 3.0000 mL | Freq: Two times a day (BID) | INTRAVENOUS | Status: DC

## 2022-05-14 MED ORDER — SODIUM CHLORIDE 0.9 % IV SOLN: 250.0000 mL | INTRAVENOUS | Status: DC | PRN

## 2022-05-14 MED ORDER — TRANEXAMIC ACID-NACL 1000-0.7 MG/100ML-% IV SOLN
INTRAVENOUS | Status: AC
  Filled 2022-05-14: qty 100

## 2022-05-14 MED ORDER — ONDANSETRON HCL 4 MG/2ML IJ SOLN: 4.0000 mg | INTRAMUSCULAR | Status: DC | PRN

## 2022-05-14 MED ORDER — SENNOSIDES-DOCUSATE SODIUM 8.6-50 MG PO TABS
2.0000 | ORAL_TABLET | Freq: Every day | ORAL | Status: DC
  Administered 2022-05-15 – 2022-05-16 (×2): 2 via ORAL
  Filled 2022-05-14 (×2): qty 2

## 2022-05-14 MED ORDER — DIBUCAINE (PERIANAL) 1 % EX OINT: 1.0000 | TOPICAL_OINTMENT | CUTANEOUS | Status: DC | PRN

## 2022-05-14 MED ORDER — EPHEDRINE 5 MG/ML INJ: 10.0000 mg | INTRAVENOUS | Status: DC | PRN

## 2022-05-14 MED ORDER — OXYCODONE HCL 5 MG PO TABS: 5.0000 mg | ORAL_TABLET | Freq: Four times a day (QID) | ORAL | Status: DC | PRN

## 2022-05-14 MED ORDER — ONDANSETRON HCL 4 MG PO TABS: 4.0000 mg | ORAL_TABLET | ORAL | Status: DC | PRN

## 2022-05-14 MED ORDER — DIPHENHYDRAMINE HCL 50 MG/ML IJ SOLN: 12.5000 mg | INTRAMUSCULAR | Status: DC | PRN

## 2022-05-14 MED ORDER — LACTATED RINGERS IV SOLN: 500.0000 mL | Freq: Once | INTRAVENOUS | Status: DC

## 2022-05-14 MED ORDER — COCONUT OIL OIL
1.0000 | TOPICAL_OIL | Status: DC | PRN
  Administered 2022-05-15: 1 via TOPICAL

## 2022-05-14 MED ORDER — WITCH HAZEL-GLYCERIN EX PADS: 1.0000 | MEDICATED_PAD | CUTANEOUS | Status: DC | PRN

## 2022-05-14 MED ORDER — OXYTOCIN-SODIUM CHLORIDE 30-0.9 UT/500ML-% IV SOLN
1.0000 m[IU]/min | INTRAVENOUS | Status: DC
  Administered 2022-05-14: 2 m[IU]/min via INTRAVENOUS

## 2022-05-14 MED ORDER — LACTATED RINGERS IV SOLN: INTRAVENOUS | Status: DC

## 2022-05-14 MED ORDER — LABETALOL HCL 5 MG/ML IV SOLN: 40.0000 mg | INTRAVENOUS | Status: DC | PRN

## 2022-05-14 MED ORDER — ZOLPIDEM TARTRATE 5 MG PO TABS: 5.0000 mg | ORAL_TABLET | Freq: Every evening | ORAL | Status: DC | PRN

## 2022-05-14 MED ORDER — LABETALOL HCL 5 MG/ML IV SOLN: 20.0000 mg | INTRAVENOUS | Status: DC | PRN

## 2022-05-14 MED ORDER — BENZOCAINE-MENTHOL 20-0.5 % EX AERO
1.0000 | INHALATION_SPRAY | CUTANEOUS | Status: DC | PRN
  Administered 2022-05-15: 1 via TOPICAL
  Filled 2022-05-14: qty 56

## 2022-05-14 MED ORDER — DIPHENHYDRAMINE HCL 25 MG PO CAPS: 25.0000 mg | ORAL_CAPSULE | Freq: Four times a day (QID) | ORAL | Status: DC | PRN

## 2022-05-14 MED ORDER — HYDRALAZINE HCL 20 MG/ML IJ SOLN: 10.0000 mg | INTRAMUSCULAR | Status: DC | PRN

## 2022-05-14 MED ORDER — MAGNESIUM SULFATE BOLUS VIA INFUSION
4.0000 g | Freq: Once | INTRAVENOUS | Status: AC
  Administered 2022-05-14: 4 g via INTRAVENOUS
  Filled 2022-05-14: qty 1000

## 2022-05-14 MED ORDER — LIDOCAINE HCL (PF) 1 % IJ SOLN
INTRAMUSCULAR | Status: DC | PRN
  Administered 2022-05-14: 3 mL via EPIDURAL
  Administered 2022-05-14: 5 mL via EPIDURAL

## 2022-05-14 MED ORDER — OXYCODONE HCL 5 MG PO TABS: 10.0000 mg | ORAL_TABLET | Freq: Four times a day (QID) | ORAL | Status: DC | PRN

## 2022-05-14 MED ORDER — SIMETHICONE 80 MG PO CHEW: 80.0000 mg | CHEWABLE_TABLET | ORAL | Status: DC | PRN

## 2022-05-14 MED ORDER — MAGNESIUM SULFATE 40 GM/1000ML IV SOLN
INTRAVENOUS | Status: AC
  Filled 2022-05-14: qty 1000

## 2022-05-14 MED ORDER — PRENATAL MULTIVITAMIN CH
1.0000 | ORAL_TABLET | Freq: Every day | ORAL | Status: DC
  Administered 2022-05-15 – 2022-05-16 (×2): 1 via ORAL
  Filled 2022-05-14: qty 1

## 2022-05-14 MED ORDER — TETANUS-DIPHTH-ACELL PERTUSSIS 5-2.5-18.5 LF-MCG/0.5 IM SUSY
0.5000 mL | PREFILLED_SYRINGE | Freq: Once | INTRAMUSCULAR | Status: AC
  Administered 2022-05-16: 0.5 mL via INTRAMUSCULAR
  Filled 2022-05-14: qty 0.5

## 2022-05-14 MED ORDER — TRANEXAMIC ACID-NACL 1000-0.7 MG/100ML-% IV SOLN
1000.0000 mg | Freq: Once | INTRAVENOUS | Status: AC
  Administered 2022-05-14: 1000 mg via INTRAVENOUS

## 2022-05-14 MED ORDER — PHENYLEPHRINE 80 MCG/ML (10ML) SYRINGE FOR IV PUSH (FOR BLOOD PRESSURE SUPPORT): 80.0000 ug | PREFILLED_SYRINGE | INTRAVENOUS | Status: DC | PRN

## 2022-05-14 MED ORDER — SODIUM CHLORIDE 0.9% FLUSH: 3.0000 mL | INTRAVENOUS | Status: DC | PRN

## 2022-05-14 MED ORDER — LABETALOL HCL 5 MG/ML IV SOLN: 80.0000 mg | INTRAVENOUS | Status: DC | PRN

## 2022-05-14 MED ORDER — MAGNESIUM SULFATE 40 GM/1000ML IV SOLN
2.0000 g/h | INTRAVENOUS | Status: AC
  Administered 2022-05-15: 2 g/h via INTRAVENOUS
  Filled 2022-05-14: qty 1000

## 2022-05-14 MED ORDER — IBUPROFEN 600 MG PO TABS
600.0000 mg | ORAL_TABLET | Freq: Four times a day (QID) | ORAL | Status: DC
  Administered 2022-05-14 – 2022-05-16 (×7): 600 mg via ORAL
  Filled 2022-05-14 (×7): qty 1

## 2022-05-14 MED ORDER — FENTANYL-BUPIVACAINE-NACL 0.5-0.125-0.9 MG/250ML-% EP SOLN
12.0000 mL/h | EPIDURAL | Status: DC | PRN
  Administered 2022-05-14: 12 mL/h via EPIDURAL
  Filled 2022-05-14: qty 250

## 2022-05-14 NOTE — Anesthesia Procedure Notes (Signed)
Epidural Patient location during procedure: OB Start time: 05/14/2022 4:48 PM End time: 05/14/2022 4:53 PM  Staffing Anesthesiologist: Linton Rump, MD Performed: anesthesiologist   Preanesthetic Checklist Completed: patient identified, IV checked, site marked, risks and benefits discussed, surgical consent, monitors and equipment checked, pre-op evaluation and timeout performed  Epidural Patient position: sitting Prep: DuraPrep and site prepped and draped Patient monitoring: continuous pulse ox and blood pressure Approach: midline Location: L3-L4 Injection technique: LOR saline  Needle:  Needle type: Tuohy  Needle gauge: 17 G Needle length: 9 cm and 9 Needle insertion depth: 7.5 cm Catheter type: closed end flexible Catheter size: 19 Gauge Catheter at skin depth: 12 cm Test dose: negative  Assessment Events: blood not aspirated, no cerebrospinal fluid, injection not painful, no injection resistance, no paresthesia and negative IV test  Additional Notes The patient has requested an epidural for labor pain management. Risks and benefits including, but not limited to, infection, bleeding, local anesthetic toxicity, headache, hypotension, back pain, block failure, etc. were discussed with the patient. The patient expressed understanding and consented to the procedure. I confirmed that the patient has no bleeding disorders and is not taking blood thinners. I confirmed the patient's last platelet count with the nurse. A time-out was performed immediately prior to the procedure. Please see nursing documentation for vital signs. Sterile technique was used throughout the whole procedure. Once LOR achieved, the epidural catheter threaded easily without resistance. Aspiration of the catheter was negative for blood and CSF. The epidural was dosed slowly and an infusion was started.  1 attempt(s)Reason for block:procedure for pain

## 2022-05-14 NOTE — Anesthesia Preprocedure Evaluation (Signed)
Anesthesia Evaluation  Patient identified by MRN, date of birth, ID band Patient awake    Reviewed: Allergy & Precautions, NPO status , Patient's Chart, lab work & pertinent test results  History of Anesthesia Complications Negative for: history of anesthetic complications  Airway Mallampati: III       Dental   Pulmonary former smoker   Pulmonary exam normal breath sounds clear to auscultation       Cardiovascular negative cardio ROS  Rhythm:Regular Rate:Normal     Neuro/Psych  Headaches    GI/Hepatic negative GI ROS, Neg liver ROS,,,  Endo/Other  negative endocrine ROS    Renal/GU negative Renal ROS     Musculoskeletal   Abdominal   Peds  Hematology negative hematology ROS (+)   Anesthesia Other Findings   Reproductive/Obstetrics (+) Pregnancy                              Anesthesia Physical Anesthesia Plan  ASA: 2  Anesthesia Plan: Epidural   Post-op Pain Management:    Induction:   PONV Risk Score and Plan:   Airway Management Planned:   Additional Equipment:   Intra-op Plan:   Post-operative Plan:   Informed Consent: I have reviewed the patients History and Physical, chart, labs and discussed the procedure including the risks, benefits and alternatives for the proposed anesthesia with the patient or authorized representative who has indicated his/her understanding and acceptance.       Plan Discussed with: Anesthesiologist  Anesthesia Plan Comments: (I have discussed risks of neuraxial anesthesia including but not limited to infection, bleeding, nerve injury, back pain, headache, seizures, and failure of block. Patient denies bleeding disorders and is not currently anticoagulated. Labs have been reviewed. Risks and benefits discussed. All patient's questions answered.  )         Anesthesia Quick Evaluation

## 2022-05-14 NOTE — Progress Notes (Signed)
Pt with some pain from contractions BP (!) 141/91   Pulse 81   Temp 98.2 F (36.8 C) (Oral)   Resp 16   Ht  (1.549 m)   Wt 70.6 kg   SpO2 100%   BMI 29.40 kg/m  3/70/-1 Continue pitocin May have epidural prn Anticipate SVD

## 2022-05-14 NOTE — Progress Notes (Signed)
Pt with some pain from contractions BP (!) 145/94   Pulse 70   Temp 98.2 F (36.8 C) (Oral)   Resp 16   Ht  (1.549 m)   Wt 70.6 kg   SpO2 100%   BMI 29.40 kg/m  Cat 1 Toco q 5 min Cytotec for ripening Will start pitocin for augmentation GBS neg Monitor BPS Anticipate SVD

## 2022-05-15 ENCOUNTER — Encounter (HOSPITAL_COMMUNITY): Payer: Self-pay | Admitting: Obstetrics and Gynecology

## 2022-05-15 LAB — COMPREHENSIVE METABOLIC PANEL
ALT: 13 U/L (ref 0–44)
AST: 19 U/L (ref 15–41)
Albumin: 2.3 g/dL — ABNORMAL LOW (ref 3.5–5.0)
Alkaline Phosphatase: 316 U/L — ABNORMAL HIGH (ref 38–126)
Anion gap: 10 (ref 5–15)
BUN: <5 mg/dL — ABNORMAL LOW (ref 6–20)
CO2: 19 mmol/L — ABNORMAL LOW (ref 22–32)
Calcium: 8.1 mg/dL — ABNORMAL LOW (ref 8.9–10.3)
Chloride: 107 mmol/L (ref 98–111)
Creatinine, Ser: 0.68 mg/dL (ref 0.44–1.00)
GFR, Estimated: >60 mL/min (ref >60)
Glucose, Bld: 95 mg/dL (ref 70–99)
Potassium: 3.2 mmol/L — ABNORMAL LOW (ref 3.5–5.1)
Sodium: 136 mmol/L (ref 135–145)
Total Bilirubin: 0.9 mg/dL (ref 0.3–1.2)
Total Protein: 4.9 g/dL — ABNORMAL LOW (ref 6.5–8.1)

## 2022-05-15 LAB — CBC
HCT: 29.2 % — ABNORMAL LOW (ref 36.0–46.0)
Hemoglobin: 10.1 g/dL — ABNORMAL LOW (ref 12.0–15.0)
MCH: 31.9 pg (ref 26.0–34.0)
MCHC: 34.6 g/dL (ref 30.0–36.0)
MCV: 92.1 fL (ref 80.0–100.0)
Platelets: 148 10*3/uL — ABNORMAL LOW (ref 150–400)
RBC: 3.17 MIL/uL — ABNORMAL LOW (ref 3.87–5.11)
RDW: 13.4 % (ref 11.5–15.5)
WBC: 12 10*3/uL — ABNORMAL HIGH (ref 4.0–10.5)
nRBC: 0 % (ref 0.0–0.2)

## 2022-05-15 LAB — LACTATE DEHYDROGENASE: LDH: 183 U/L (ref 98–192)

## 2022-05-15 MED ORDER — DONOR BREAST MILK (FOR LABEL PRINTING ONLY): ORAL | Status: DC

## 2022-05-15 NOTE — Progress Notes (Signed)
PPD# 1 SVD w/ 2nd degree Information for the patient's newborn:  Mariah Sosa, Mariah Sosa [[id number]]  female    Circumcision prior to discharge  S:   Reports feeling tired Tolerating PO fluid and solids No nausea or vomiting Bleeding is moderate, no clots Pain controlled with  PO meds Up ad lib / ambulatory / voiding w/o difficulty Feeding: Breast and and donor milk     O:   VS: BP 122/83 (BP Location: Left Arm)   Pulse 92   Temp 97.9 F (36.6 C) (Oral)   Resp 18   Ht  (1.549 m)   Wt 70.6 kg   SpO2 99%   Breastfeeding Unknown   BMI 29.40 kg/m   LABS:  Recent Labs    05/14/22 2224 05/15/22 0503  WBC 12.3* 12.0*  HGB 11.4* 10.1*  PLT 151 148*   Blood type: --/--/A POS (04/17 1157) Rubella: Immune (10/19 0000)                      I&O: Intake/Output      04/18 0701 04/19 0700 04/19 0701 04/20 0700   P.O. 700 240   I.V. (mL/kg) 3704 (52.5) 427 (6)   Total Intake(mL/kg) 4404 (62.4) 667 (9.4)   Urine (mL/kg/hr) 2000 (1.2) 1100 (1.8)   Total Output 2000 1100   Net +2404 -433          Physical Exam: Alert and oriented X3 Lungs: Clear and unlabored Heart: regular rate and rhythm / no mumurs Abdomen: soft, non-tender, non-distended  Fundus: firm, non-tender, U- Perineum: well-approximated Lochia: appropriate Extremities: trace edema, no calf pain, tenderness, or cords    A:  PPD # 1  Pre-eclampsia w/o severe features  P:  Routine post partum orders D/C magnesium sulfate @ 2345 Anticipate D/C on PP day 2 or 3  Plan reviewed w/ Dr. Verner Mould, DNP, CNM 05/15/2022, 3:41 PM

## 2022-05-15 NOTE — Lactation Note (Signed)
This note was copied from a baby's chart. Lactation Consultation Note  Patient Name: Mariah Sosa [id number] Date: 05/15/2022 Age:26 hours Reason for consult: Initial assessment;1st time breastfeeding;Term Birth Parent requested [facility] assistance with latching infant at the breast. Birth Parent latched infant on her right breast using the football hold position, infant latched sustaining latch and was still breastfeeding after 12 minutes when LC left the room. Birth Parent will continue to BF infant by cues, on demand, 8 to 12+ times within 24 hours, STS. Birth Parent knows to ask RN/LC for further latch assistance if needed. LC discussed infant's input and output. The importance of maternal rest, diet and hydration. Birth Parent had DEBP kit sitting on the counter, Per Birth Parent she prefers working on latching infant at the breast tonight and will use DEBP in morning. Birth Parent was made aware of O/P services, breastfeeding support groups, community resources, and our phone # for post-discharge questions.    Maternal Data Has patient been taught Hand Expression?: No Does the patient have breastfeeding experience prior to this delivery?: No  Feeding Mother's Current Feeding Choice: Breast Milk  LATCH Score Latch: Grasps breast easily, tongue down, lips flanged, rhythmical sucking.  Audible Swallowing: A few with stimulation  Type of Nipple: Everted at rest and after stimulation  Comfort (Breast/Nipple): Soft / non-tender  Hold (Positioning): Assistance needed to correctly position infant at breast and maintain latch.  LATCH Score: 8   Lactation Tools Discussed/Used    Interventions Interventions: Breast feeding basics reviewed;Adjust position;Assisted with latch;Support pillows;Skin to skin;Position options;Education;Breast massage;Breast compression;LC Services brochure  Discharge Pump: DEBP;Personal  Consult Status Consult Status: Follow-up Date: 05/15/22 Follow-up type:  In-patient    Frederico Hamman 05/15/2022, 2:53 AM

## 2022-05-15 NOTE — Anesthesia Postprocedure Evaluation (Signed)
Anesthesia Post Note  Patient: Mariah Sosa  Procedure(s) Performed: AN AD HOC LABOR EPIDURAL     Patient location during evaluation: Mother Baby Anesthesia Type: Epidural Level of consciousness: awake and alert and oriented Pain management: satisfactory to patient Vital Signs Assessment: post-procedure vital signs reviewed and stable Respiratory status: respiratory function stable Cardiovascular status: stable Postop Assessment: no headache, no backache, epidural receding, patient able to bend at knees, no signs of nausea or vomiting, adequate PO intake and able to ambulate Anesthetic complications: no   No notable events documented.  Last Vitals:  Vitals:   05/15/22 0652 05/15/22 0841  BP: (!) 121/90 105/75  Pulse: 75 83  Resp: 17 18  Temp: 36.7 C 36.7 C  SpO2: 98% 99%    Last Pain:  Vitals:   05/15/22 0845  TempSrc:   PainSc: 0-No pain   Pain Goal:                   [name]

## 2022-05-16 ENCOUNTER — Ambulatory Visit (HOSPITAL_COMMUNITY): Payer: Self-pay

## 2022-05-16 DIAGNOSIS — O139 Gestational [pregnancy-induced] hypertension without significant proteinuria, unspecified trimester: Secondary | ICD-10-CM | POA: Insufficient documentation

## 2022-05-16 MED ORDER — TETANUS-DIPHTH-ACELL PERTUSSIS 5-2.5-18.5 LF-MCG/0.5 IM SUSY: 0.5000 mL | PREFILLED_SYRINGE | Freq: Once | INTRAMUSCULAR | Status: AC

## 2022-05-16 MED ORDER — NIFEDIPINE ER OSMOTIC RELEASE 30 MG PO TB24
30.0000 mg | ORAL_TABLET | Freq: Every day | ORAL | Status: DC
  Administered 2022-05-16: 30 mg via ORAL
  Filled 2022-05-16: qty 1

## 2022-05-16 MED ORDER — IBUPROFEN 600 MG PO TABS: 600.0000 mg | ORAL_TABLET | Freq: Four times a day (QID) | ORAL | 0 refills | Status: AC

## 2022-05-16 MED ORDER — NIFEDIPINE ER 30 MG PO TB24: 30.0000 mg | ORAL_TABLET | Freq: Every day | ORAL | 2 refills | Status: AC

## 2022-05-16 MED ORDER — DOCUSATE SODIUM 100 MG PO CAPS: 100.0000 mg | ORAL_CAPSULE | Freq: Two times a day (BID) | ORAL | 0 refills | Status: AC

## 2022-05-16 NOTE — Lactation Note (Signed)
This note was copied from a baby's chart. Lactation Consultation Note  Patient Name: Mariah Sosa Date: 05/16/2022 Age:25 hours  Reason for consult: 1st time breastfeeding;Term;Follow-up assessment;Primapara   P1, GA [redacted]w[redacted]d, 3% weight loss  Mother has not pumped because she " on magnesium" and thought she couldn't give the baby her milk. Baby has been getting donor breast milk. Infant just had 18 ml of DBM. Mother was receptive to assist with latch. Basic breastfeeding education with latch and position. Infant was sleepy and did not show any feeding cues.   Mother reports she has a pump at home and she will transition baby to formula for now until baby is latching. Instructed to attempt to breastfeed with feeding cues, 8-12 times/ 24 hours, skin to skin and supplement until baby is breastfeeding. Instructed to pump every 3 hours for 15 min to establish her milk supply.  Mother has requested lactation support following discharge. Referral made to [facility].  Mom made aware of O/P services, breastfeeding support groups, and our phone # for post-discharge questions.    Maternal Data Has patient been taught Hand Expression?: Yes Does the patient have breastfeeding experience prior to this delivery?: No  Feeding Mother's Current Feeding Choice: Breast Milk and Donor Milk      Interventions Interventions: Breast feeding basics reviewed;Assisted with latch;Skin to skin;Breast compression;Adjust position;Support pillows;Education;LC Services brochure  Discharge Discharge Education: Engorgement and breast care;Warning signs for feeding baby Pump: DEBP;Personal  Consult Status Consult Status: Complete Date: 05/16/22    Omar Person 05/16/2022, 4:08 PM

## 2022-05-16 NOTE — Progress Notes (Signed)
PPD# 2 SVD w/ 2nd degree laceration Information for the patient's newborn:  Minney, Boy Aylanie [[id number]]  female   Baby Name Dareon Circumcision prior to discharge   S:   Reports feeling good, denies HA, visual changes, and RUQ pain Tolerating PO fluid and solids No nausea or vomiting Bleeding is light and without clots Pain controlled with  PO meds Up ad lib / ambulatory / voiding w/o difficulty Feeding: Breast and donor milk     O:   VS: BP (!) 144/93 (BP Location: Left Arm)   Pulse 71   Temp 98.2 F (36.8 C) (Oral)   Resp 16   Ht  (1.549 m)   Wt 70.6 kg   SpO2 100%   Breastfeeding Unknown   BMI 29.40 kg/m   LABS:  Recent Labs    05/14/22 2224 05/15/22 0503  WBC 12.3* 12.0*  HGB 11.4* 10.1*  PLT 151 148*   Blood type: --/--/A POS (04/17 1157) Rubella: Immune (10/19 0000)                      I&O: Intake/Output      04/19 0701 04/20 0700   P.O. 1960   I.V. (mL/kg) 983.1 (13.9)   Total Intake(mL/kg) 2943.1 (41.7)   Urine (mL/kg/hr) 2700 (1.6)   Total Output 2700   Net +243.1       Urine Occurrence 1 x     Physical Exam: Alert and oriented X3 Lungs: Clear and unlabored Heart: regular rate and rhythm / no mumurs Abdomen: soft, non-tender, non-distended  Fundus: firm, non-tender, U-2 Perineum: intact Lochia: appropriate Extremities: trace edema, no calf pain, tenderness, or cords    A:  PPD # 1  Pre-eclampsia w/o severe features    -normotensive to mild range BP since magnesium was stopped  P:  Routine post partum orders Consider oral antihypertensives 1-week BP check in office Anticipate D/C on PP #2  Plan reviewed w/ Dr. Judie Petit, Patte, [id number], 5:07 AM

## 2022-05-16 NOTE — Discharge Summary (Signed)
OB Discharge Summary     Patient Name: Mariah Sosa DOB: [birth year] MRN: [id number]  Date of admission: 05/13/2022 Delivering MD: Steva Ready   Date of discharge: 05/16/2022  Admitting diagnosis: Full-term prem ROM, onset labor within 24 hours of rupture [O42.02] Post-dates pregnancy [O48.0] Intrauterine pregnancy: [redacted]w[redacted]d     Secondary diagnosis:  Principal Problem:   Postpartum care following vaginal delivery Active Problems:   Full-term prem ROM, onset labor within 24 hours of rupture   Post-dates pregnancy   Gestational hypertension  Additional problems: None     Discharge diagnosis: Term Pregnancy Delivered and Gestational Hypertension                                                                                                Post partum procedures: None  Augmentation: Pitocin and Cytotec  Complications: None  Hospital course:  Induction of Labor With Vaginal Delivery   27 y.o. yo [id number] at [redacted]w[redacted]d was admitted to the hospital 05/13/2022 for induction of labor.  Indication for induction: PROM.  Patient had an labor course complicated by new onset GHTN Membrane Rupture Time/Date: 8:53 AM ,05/13/2022   Delivery Method:Vaginal, Spontaneous  Episiotomy:   Lacerations:  2nd degree;Periurethral  Details of delivery can be found in separate delivery note.  Patient had a postpartum course complicated by none. Pt. S/p PP MgSO4 x24hr PP and started on Procardia  XL on 4/20 due to mild range BP after MgSO4. Patient is discharged home 05/16/22.  Newborn Data: Birth date:05/14/2022  Birth time:8:55 PM  Gender:Female  Living status:Living  Apgars:9 ,9  Weight:3550 g   Physical exam  Vitals:   05/15/22 2348 05/16/22 0356 05/16/22 0835 05/16/22 1150  BP: 113/75 (!) 144/93 133/87 123/86  Pulse: 87 71 61 70  Resp: Temp: 98.1 F (36.7 C) 98.2 F (36.8 C) 98.4 F (36.9 C) 97.8 F (36.6 C)  TempSrc: Oral Oral Axillary Oral  SpO2: 99% 100% 100% 98%   Weight:      Height:       General: alert, cooperative, and no distress Lochia: appropriate Uterine Fundus: firm Incision: N/A DVT Evaluation: No evidence of DVT seen on physical exam. Labs: Lab Results  Component Value Date   WBC 12.0 (H) 05/15/2022   HGB 10.1 (L) 05/15/2022   HCT 29.2 (L) 05/15/2022   MCV 92.1 05/15/2022   PLT 148 (L) 05/15/2022      Latest Ref Rng & Units 05/15/2022    5:03 AM  CMP  Glucose 70 - 99 mg/dL 95   BUN 6 - 20 mg/dL <5   Creatinine 1.47 - 1.00 mg/dL 8.29   Sodium 562 - 130 mmol/L 136   Potassium 3.5 - 5.1 mmol/L 3.2   Chloride 98 - 111 mmol/L 107   CO2 22 - 32 mmol/L 19   Calcium 8.9 - 10.3 mg/dL 8.1   Total Protein 6.5 - 8.1 g/dL 4.9   Total Bilirubin 0.3 - 1.2 mg/dL 0.9   Alkaline Phos 38 - 126 U/L 316   AST 15 - 41 U/L 19  ALT 0 - 44 U/L 13     Discharge instruction: per After Visit Summary and "Baby and Me Booklet".  After visit meds:  Allergies as of 05/16/2022   No Known Allergies      Medication List     STOP taking these medications    misoprostol 200 MCG tablet Commonly known as: CYTOTEC       TAKE these medications    docusate sodium 100 MG capsule Commonly known as: Colace Take 1 capsule (100 mg total) by mouth 2 (two) times daily for 10 days.   ibuprofen 600 MG tablet Commonly known as: ADVIL Take 1 tablet (600 mg total) by mouth every 6 (six) hours. What changed:  when to take this reasons to take this   multivitamin-prenatal 27-0.8 MG Tabs tablet Take 1 tablet by mouth daily at 12 noon.   NIFEdipine 30 MG 24 hr tablet Commonly known as: ADALAT CC Take 1 tablet (30 mg total) by mouth daily. Start taking on: May 17, 2022        Diet: routine diet and low salt diet  Activity: Advance as tolerated. Pelvic rest for 6 weeks.   Outpatient follow up:1 week for BP check and 6 weeks for routine PP visit Follow up Appt:No future appointments. Follow up Visit:No follow-ups on file.  Postpartum  contraception: Undecided  Newborn Data: Live born female  Birth Weight: 7 lb 13.2 oz (3550 g) APGAR: 9, 9  Newborn Delivery   Birth date/time: 05/14/2022 20:55:14 Delivery type: Vaginal, Spontaneous      Baby Feeding: Breast and donor milk Disposition:home with mother   Magnesium Sulfate received: Yes: Seizure prophylaxis BMZ received: No Rhophylac:N/A MMR:N/A T-DaP: ordered to be given prior to discharge Flu: N/A Transfusion:No   Edinburgh Score:    05/15/2022   12:42 PM  Edinburgh Postnatal Depression Scale Screening Tool  I have been able to laugh and see the funny side of things. 0  I have looked forward with enjoyment to things. 0  I have blamed myself unnecessarily when things went wrong. 1  I have been anxious or worried for no good reason. 1  I have felt scared or panicky for no good reason. 0  Things have been getting on top of me. 0  I have been so unhappy that I have had difficulty sleeping. 0  I have felt sad or miserable. 0  I have been so unhappy that I have been crying. 0  The thought of harming myself has occurred to me. 0  Edinburgh Postnatal Depression Scale Total 2      05/16/2022 Jackie Plum, MD

## 2022-05-16 NOTE — Plan of Care (Signed)
  Problem: Education: Goal: Knowledge of Childbirth will improve Outcome: Completed/Met Goal: Ability to make informed decisions regarding treatment and plan of care will improve Outcome: Completed/Met Goal: Ability to state and carry out methods to decrease the pain will improve Outcome: Completed/Met Goal: Individualized Educational Video(s) Outcome: Completed/Met   Problem: Coping: Goal: Ability to verbalize concerns and feelings about labor and delivery will improve Outcome: Completed/Met   Problem: Life Cycle: Goal: Ability to make normal progression through stages of labor will improve Outcome: Completed/Met Goal: Ability to effectively push during vaginal delivery will improve Outcome: Completed/Met   Problem: Role Relationship: Goal: Will demonstrate positive interactions with the child Outcome: Completed/Met   Problem: Safety: Goal: Risk of complications during labor and delivery will decrease Outcome: Completed/Met   Problem: Pain Management: Goal: Relief or control of pain from uterine contractions will improve Outcome: Completed/Met   Problem: Education: Goal: Knowledge of General Education information will improve Description: Including pain rating scale, medication(s)/side effects and non-pharmacologic comfort measures Outcome: Completed/Met   Problem: Health Behavior/Discharge Planning: Goal: Ability to manage health-related needs will improve Outcome: Completed/Met   Problem: Clinical Measurements: Goal: Ability to maintain clinical measurements within normal limits will improve Outcome: Completed/Met Goal: Will remain free from infection Outcome: Completed/Met Goal: Diagnostic test results will improve Outcome: Completed/Met Goal: Respiratory complications will improve Outcome: Completed/Met Goal: Cardiovascular complication will be avoided Outcome: Completed/Met   Problem: Activity: Goal: Risk for activity intolerance will decrease Outcome:  Completed/Met   Problem: Nutrition: Goal: Adequate nutrition will be maintained Outcome: Completed/Met   Problem: Coping: Goal: Level of anxiety will decrease Outcome: Completed/Met   Problem: Elimination: Goal: Will not experience complications related to bowel motility Outcome: Completed/Met Goal: Will not experience complications related to urinary retention Outcome: Completed/Met   Problem: Pain Managment: Goal: General experience of comfort will improve Outcome: Completed/Met   Problem: Safety: Goal: Ability to remain free from injury will improve Outcome: Completed/Met   Problem: Skin Integrity: Goal: Risk for impaired skin integrity will decrease Outcome: Completed/Met   Problem: Education: Goal: Knowledge of disease or condition will improve Outcome: Completed/Met Goal: Knowledge of the prescribed therapeutic regimen will improve Outcome: Completed/Met   Problem: Fluid Volume: Goal: Peripheral tissue perfusion will improve Outcome: Completed/Met   Problem: Clinical Measurements: Goal: Complications related to disease process, condition or treatment will be avoided or minimized Outcome: Completed/Met   Problem: Education: Goal: Knowledge of condition will improve Outcome: Completed/Met Goal: Individualized Educational Video(s) Outcome: Completed/Met Goal: Individualized Newborn Educational Video(s) Outcome: Completed/Met   Problem: Activity: Goal: Will verbalize the importance of balancing activity with adequate rest periods Outcome: Completed/Met Goal: Ability to tolerate increased activity will improve Outcome: Completed/Met   Problem: Coping: Goal: Ability to identify and utilize available resources and services will improve Outcome: Completed/Met   Problem: Life Cycle: Goal: Chance of risk for complications during the postpartum period will decrease Outcome: Completed/Met   Problem: Role Relationship: Goal: Ability to demonstrate positive  interaction with newborn will improve Outcome: Completed/Met   Problem: Skin Integrity: Goal: Demonstration of wound healing without infection will improve Outcome: Completed/Met   

## 2022-05-20 ENCOUNTER — Inpatient Hospital Stay (HOSPITAL_COMMUNITY): Payer: BC Managed Care – PPO

## 2022-05-20 ENCOUNTER — Inpatient Hospital Stay (HOSPITAL_COMMUNITY)
Admission: RE | Admit: 2022-05-20 | Payer: BC Managed Care – PPO | Source: Home / Self Care | Admitting: Obstetrics and Gynecology

## 2022-05-26 ENCOUNTER — Telehealth (HOSPITAL_COMMUNITY): Payer: Self-pay | Admitting: *Deleted

## 2022-05-26 NOTE — Telephone Encounter (Signed)
Voice mailbox full.  Duffy Rhody, RN 05-26-2022 at 9:24am

## 2022-10-30 IMAGING — US US OB < 14 WEEKS - US OB TV
1 series · 15 of 21 positions shown · non-contrast
Comparison: None.
COMPARISON: None.

Addendum:
CLINICAL DATA: Pelvic pain, vaginal bleeding



[Series 2: us ob < 14 weeks - us ob tv · 21 acquisitions, 15 frames shown]
[im 1/21]
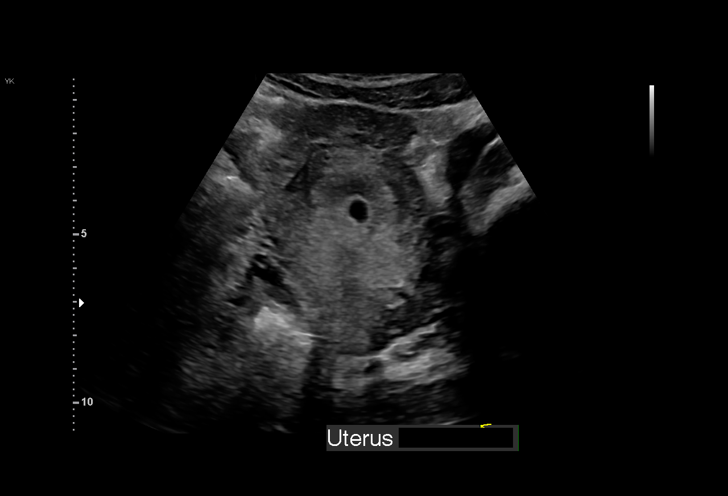
[im 3/21]
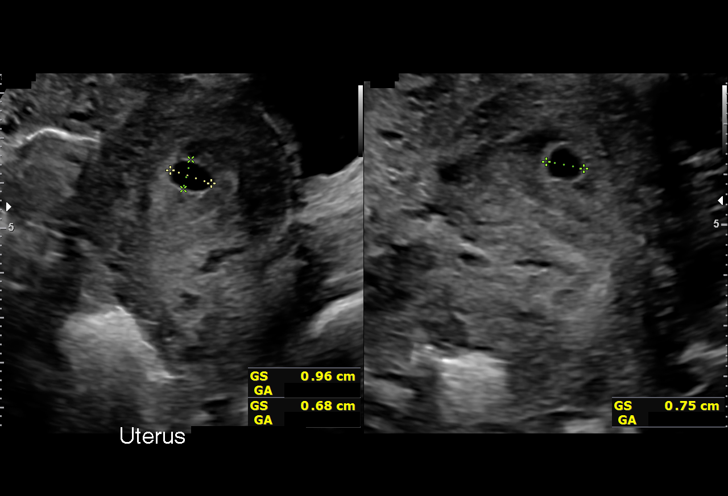
[im 4/21]
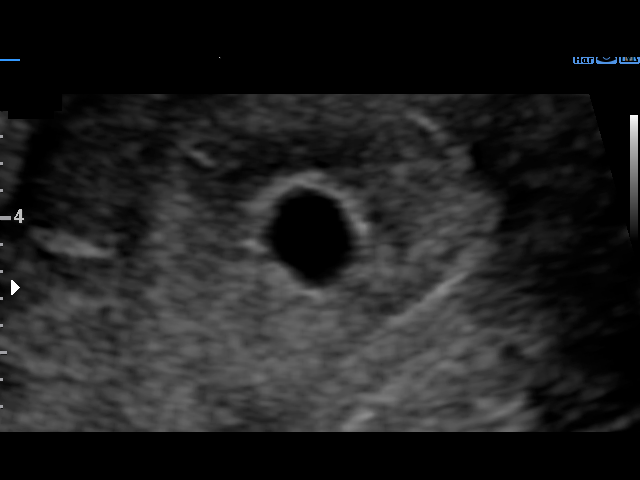
[im 5/21]
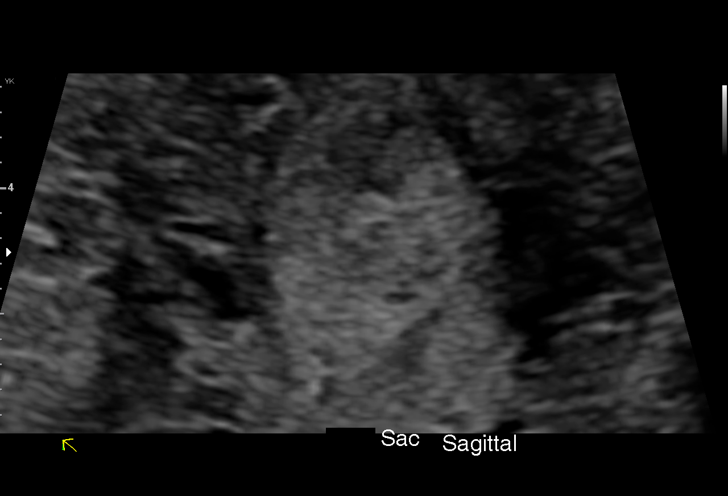
[im 7/21]
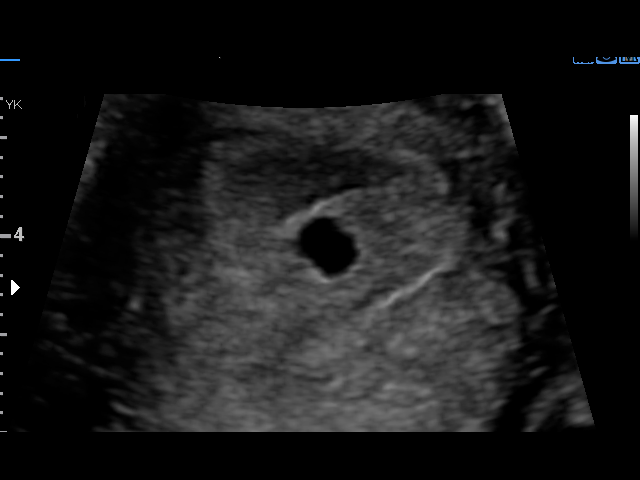
[im 8/21]
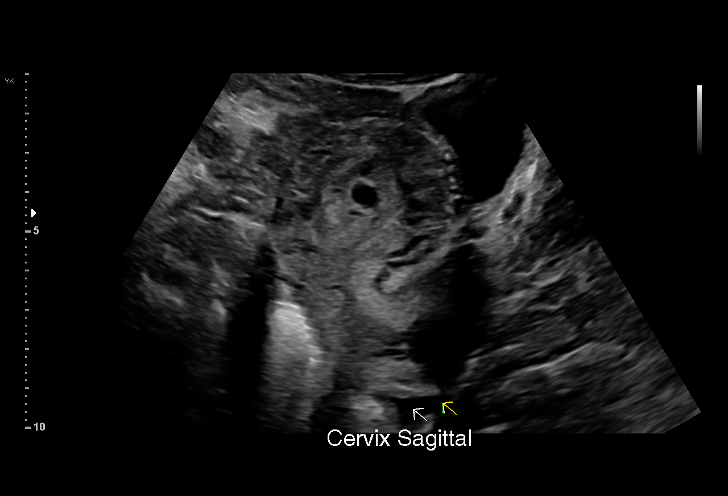
[im 10/21]
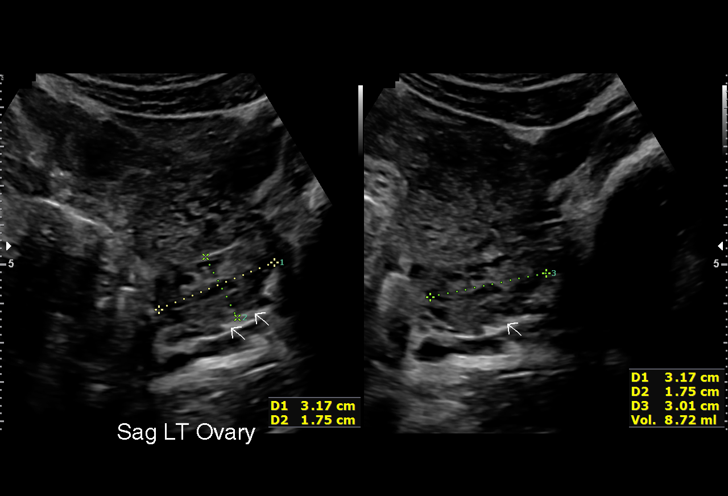
[im 11/21]
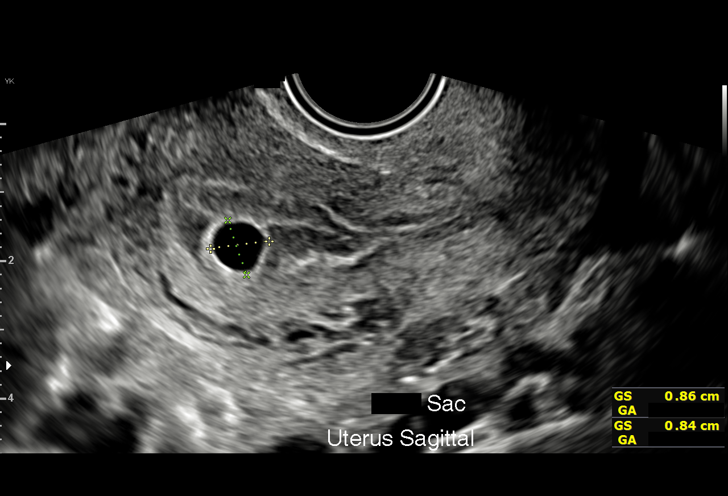
[im 12/21]
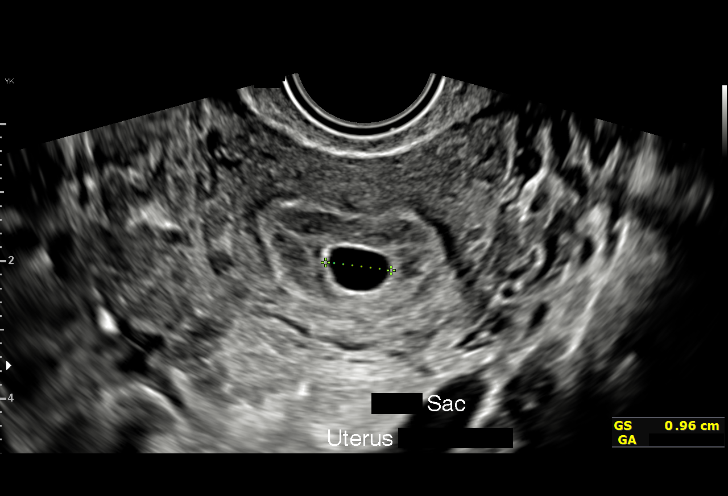
[im 14/21]
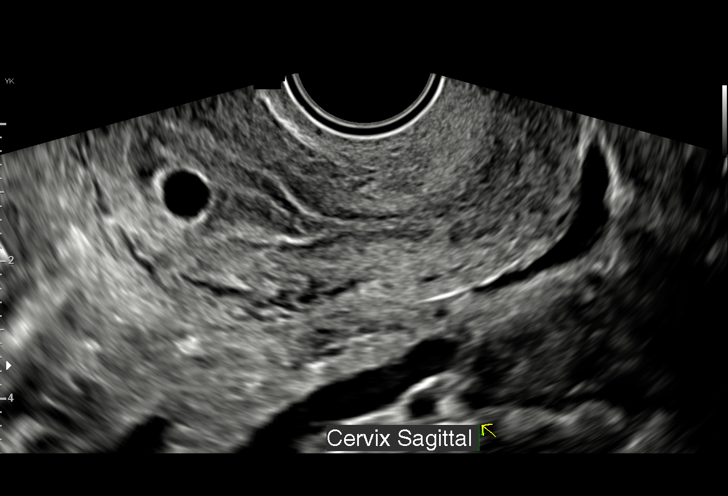
[im 15/21]
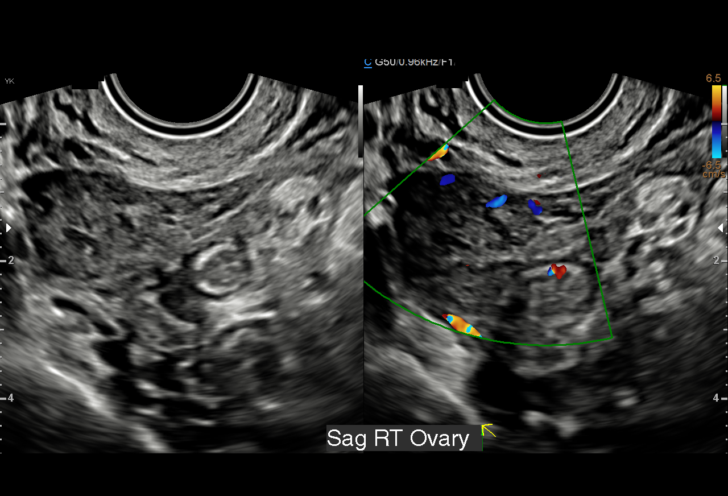
[im 17/21]
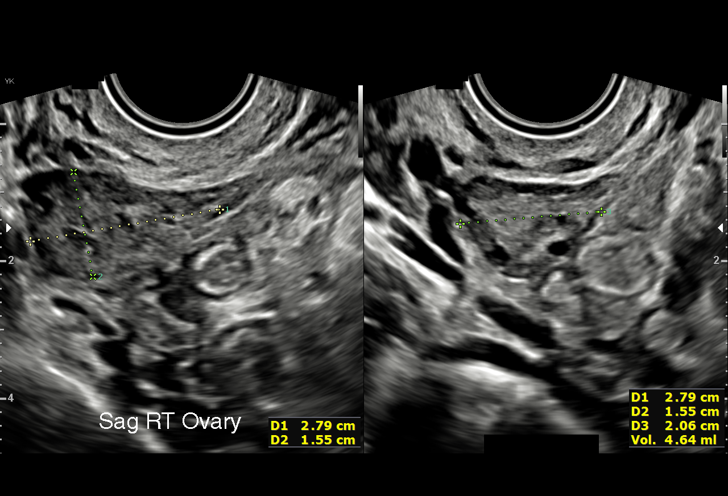
[im 18/21]
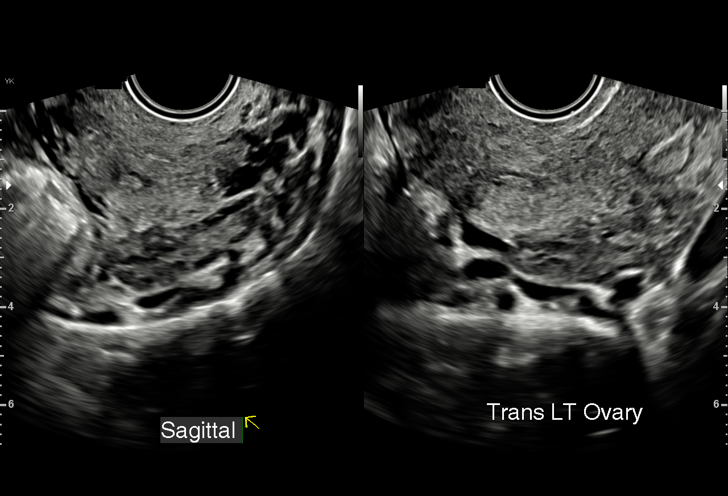
[im 19/21]
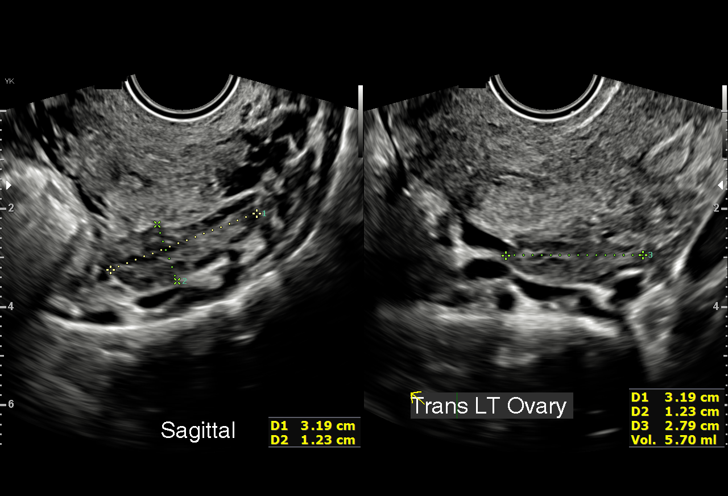
[im 21/21]
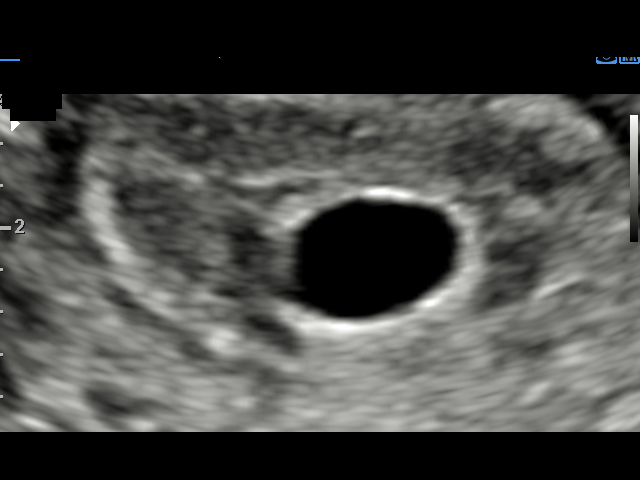

[15 of 21 positions shown; findings below may reference images not displayed]

FINDINGS: Intrauterine gestational sac: Single

Yolk sac:  Not seen

Embryo:  Not

Cardiac Activity: Not seen

MSD: 8.8 mm suggesting gestational age of 5 weeks 4 days.

Subchorionic hemorrhage:  None visualized.

Maternal uterus/adnexae: Unremarkable.

Pulsed Doppler evaluation of both ovaries demonstrates normal
appearing low-resistance arterial and venous waveforms.
IMPRESSION: There is gestational sac within the fundus of the uterus. There is
no demonstrable fetal pole or yolk sac within the gestational sac.
Differential diagnostic possibilities would include very early
normal IUP or failed gestation with incomplete abortion. Serial HCG
estimations and follow-up sonogram as warranted should be considered
in 1-2 weeks.

ADDENDUM:
This addendum is made to clarify Doppler technique used for the
study. Color flow Doppler examination was done. Pulsed Doppler
examination was not performed.

*** End of Addendum ***
FINDINGS: Intrauterine gestational sac: Single

Yolk sac:  Not seen

Embryo:  Not

Cardiac Activity: Not seen

MSD: 8.8 mm suggesting gestational age of 5 weeks 4 days.

Subchorionic hemorrhage:  None visualized.

Maternal uterus/adnexae: Unremarkable.

Pulsed Doppler evaluation of both ovaries demonstrates normal
appearing low-resistance arterial and venous waveforms.
IMPRESSION: There is gestational sac within the fundus of the uterus. There is
no demonstrable fetal pole or yolk sac within the gestational sac.
Differential diagnostic possibilities would include very early
normal IUP or failed gestation with incomplete abortion. Serial HCG
estimations and follow-up sonogram as warranted should be considered
in 1-2 weeks.

## 2022-11-16 IMAGING — US US OB TRANSVAGINAL
1 series · 15 of 27 positions shown · non-contrast
Comparison: 04/27/2021.

CLINICAL DATA: Vaginal bleeding, declining beta HCG levels.

EXAM:
TRANSVAGINAL OB ULTRASOUND
TECHNIQUE: Transvaginal ultrasound was performed for complete evaluation of the
gestation as well as the maternal uterus, adnexal regions, and
pelvic cul-de-sac.

[Series 1: us ob transvaginal · 27 acquisitions, 15 frames shown]
[im 1/27]
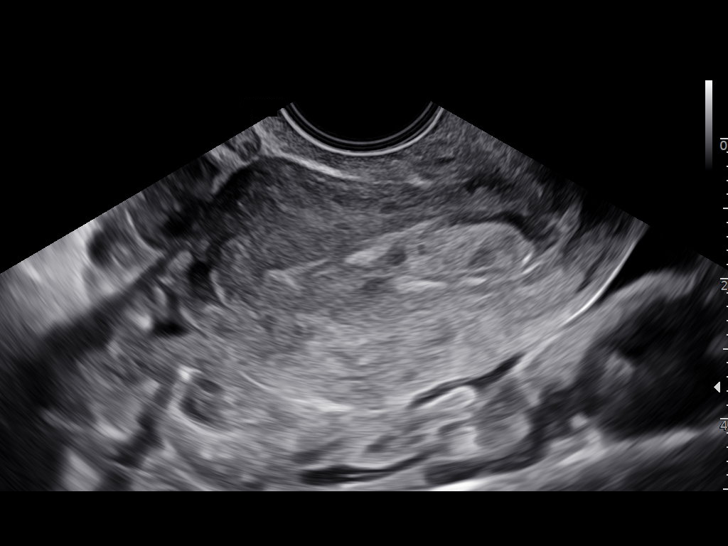
[im 3/27]
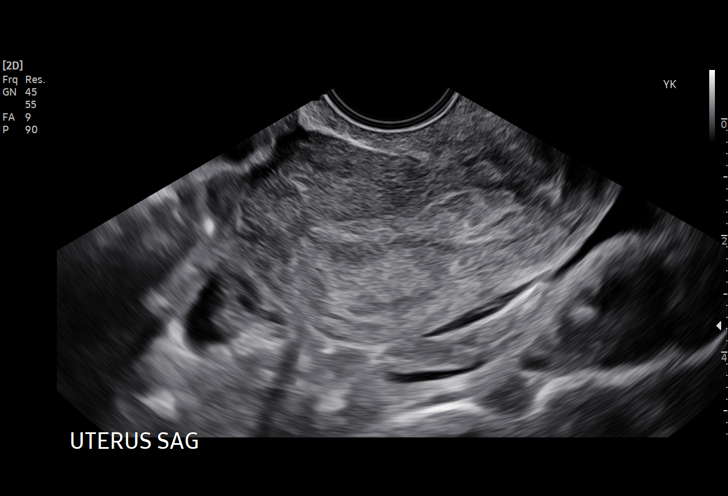
[im 5/27]
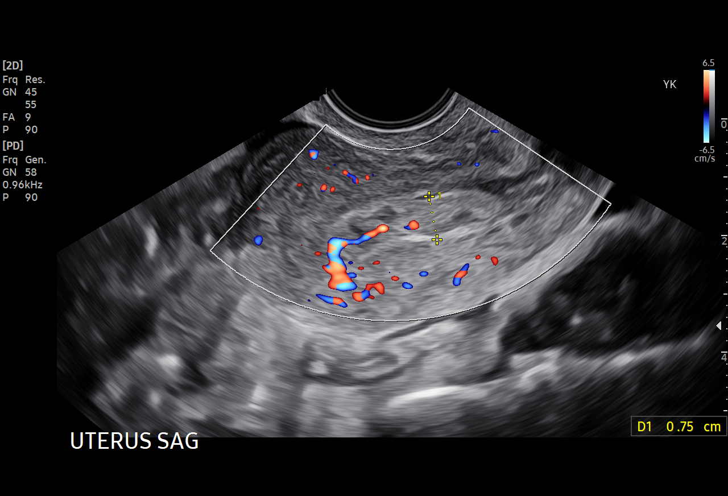
[im 7/27]
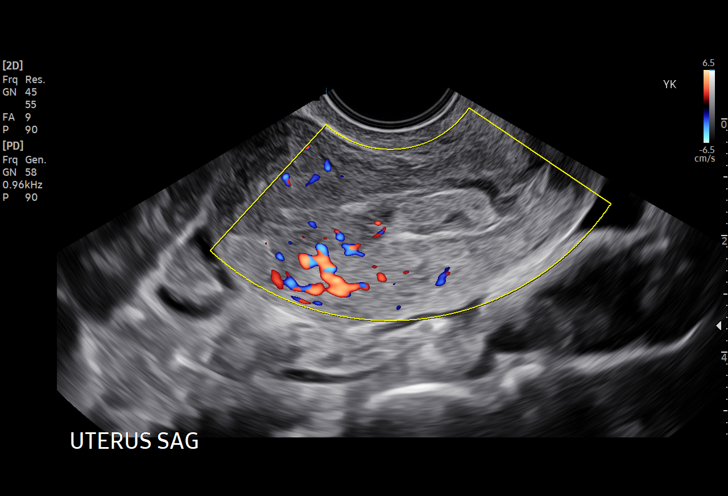
[im 9/27]
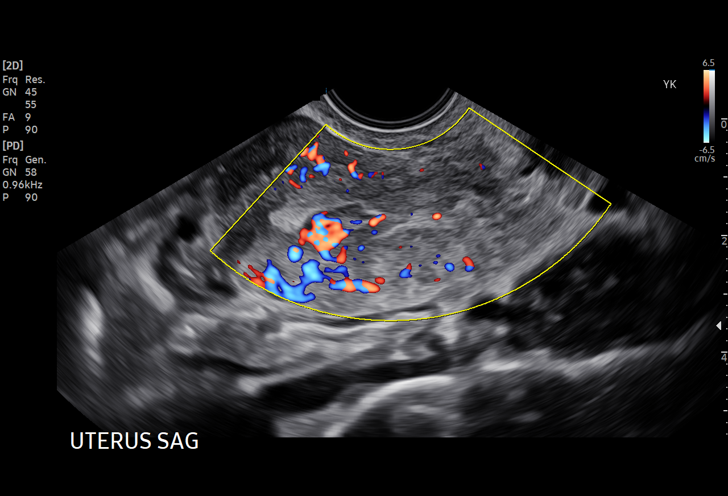
[im 10/27]
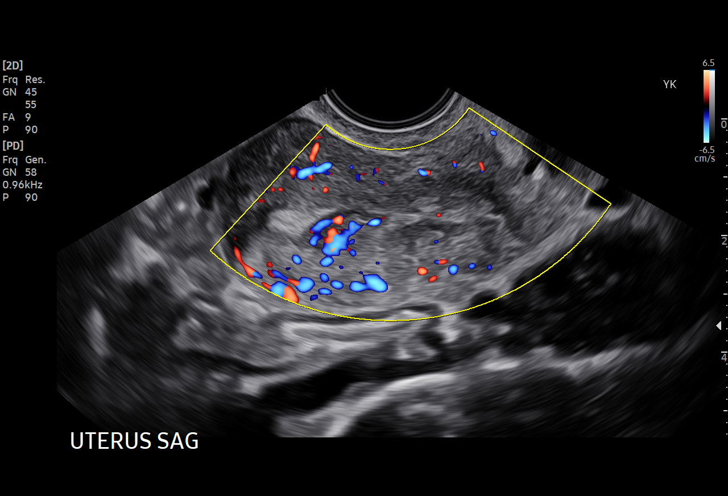
[im 12/27]
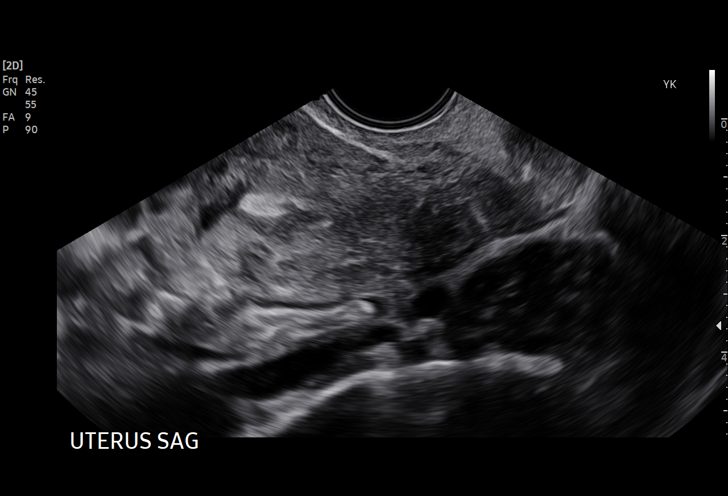
[im 14/27]
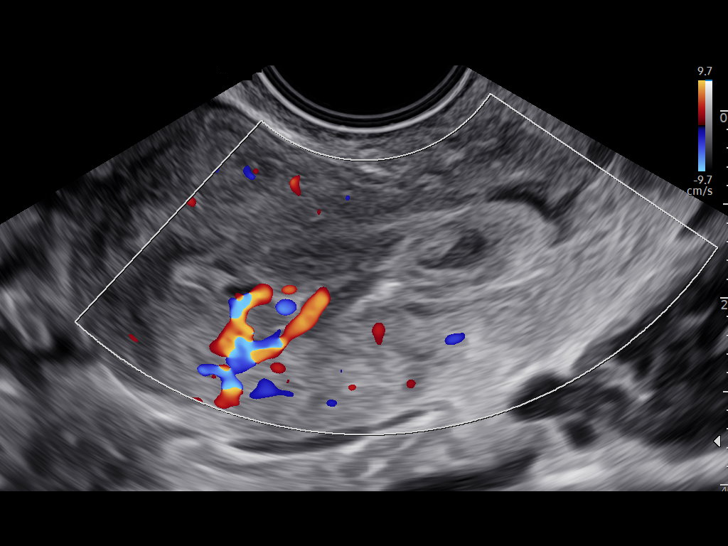
[im 16/27]
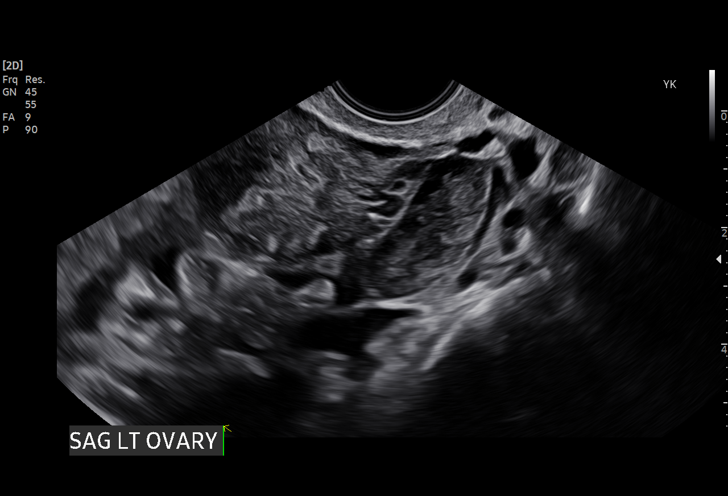
[im 18/27]
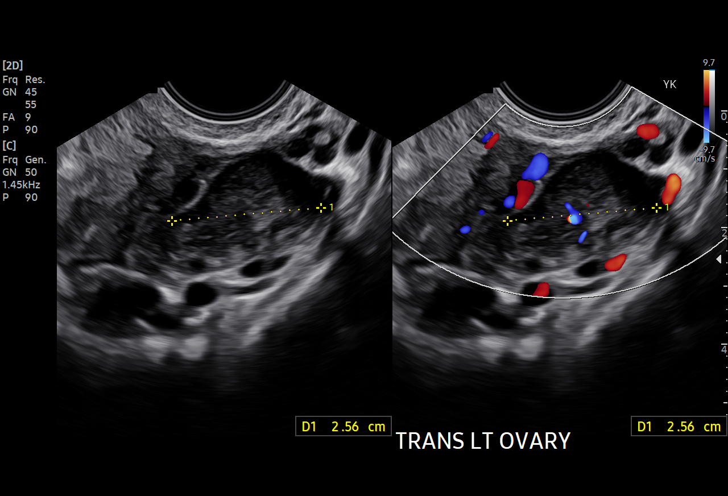
[im 19/27]
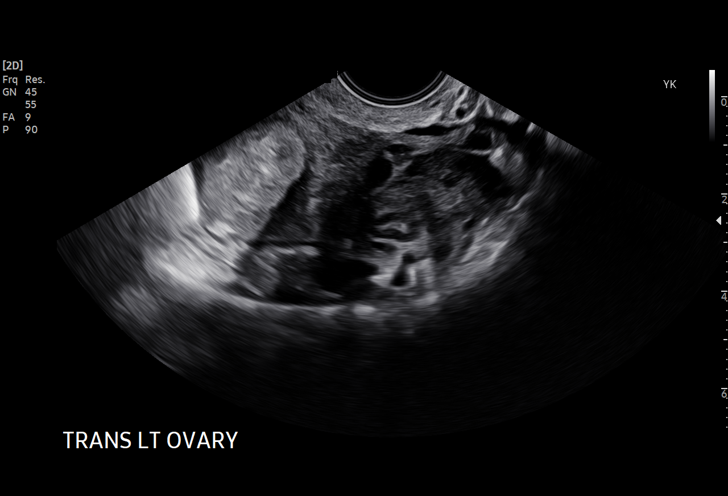
[im 21/27]
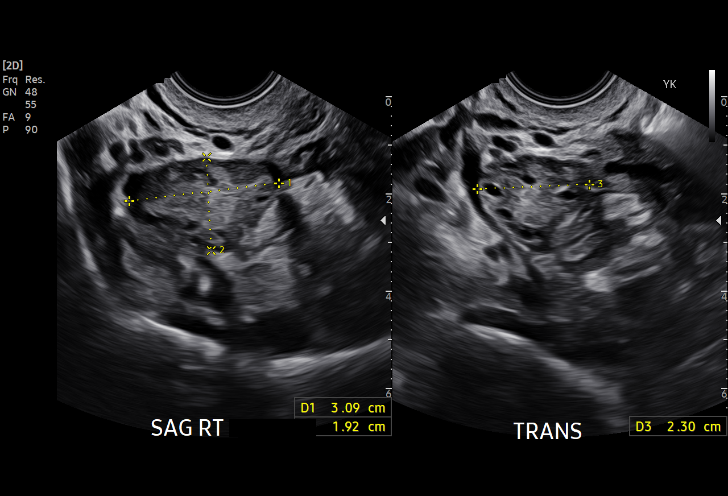
[im 23/27]
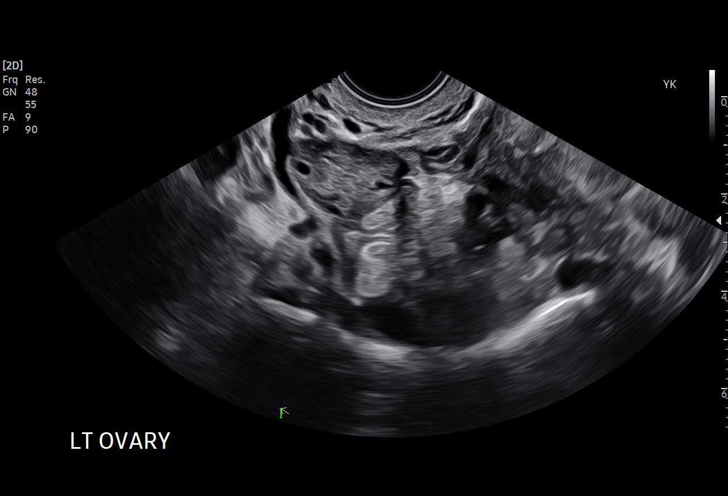
[im 25/27]
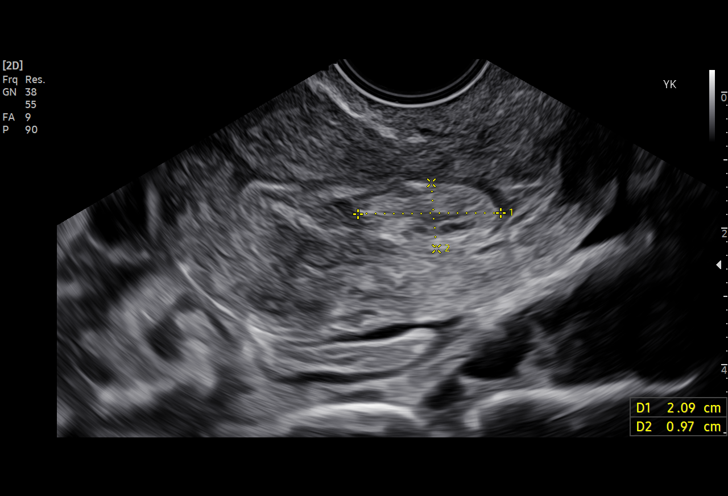
[im 27/27]
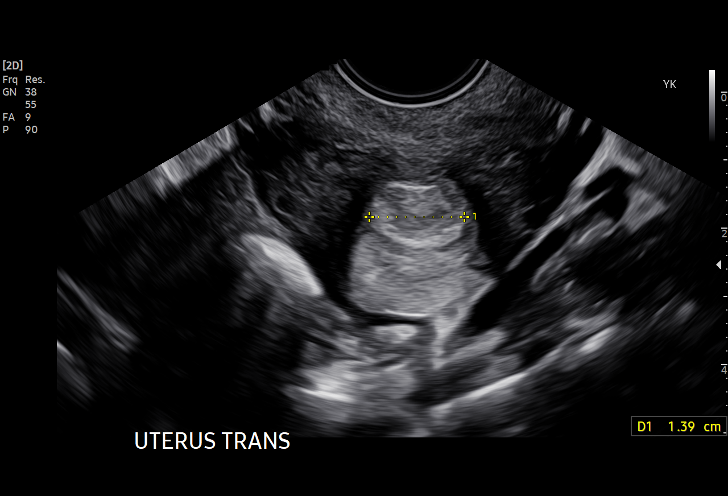

[15 of 27 positions shown; findings below may reference images not displayed]

FINDINGS: Intrauterine gestational sac: Absent.

Yolk sac:  Absent.

Embryo:  Absent.

Cardiac Activity: Absent.

Maternal uterus/adnexae: Heterogeneous thickened endometrium with
internal vascularity, measuring 9 mm. Ovaries are unremarkable.
Trace pelvic free fluid.
IMPRESSION: Heterogeneous thickened endometrium with internal vascularity. No
gestational sac, as seen on the prior ultrasound. Findings meet
definitive criteria for failed pregnancy. This follows SRU consensus
guidelines: Diagnostic Criteria for Nonviable Pregnancy Early in the
First Trimester. N Engl J Med 0827;[DATE].

## 2024-01-17 ENCOUNTER — Other Ambulatory Visit: Payer: Self-pay

## 2024-01-17 ENCOUNTER — Emergency Department (HOSPITAL_BASED_OUTPATIENT_CLINIC_OR_DEPARTMENT_OTHER)

## 2024-01-17 ENCOUNTER — Emergency Department (HOSPITAL_BASED_OUTPATIENT_CLINIC_OR_DEPARTMENT_OTHER)
Admission: EM | Admit: 2024-01-17 | Discharge: 2024-01-17 | Disposition: A | Discharge status: Home / Self Care | Attending: Emergency Medicine | Admitting: Emergency Medicine

## 2024-01-17 ENCOUNTER — Encounter (HOSPITAL_BASED_OUTPATIENT_CLINIC_OR_DEPARTMENT_OTHER): Payer: Self-pay

## 2024-01-17 DIAGNOSIS — O039 Complete or unspecified spontaneous abortion without complication: Secondary | ICD-10-CM | POA: Diagnosis not present

## 2024-01-17 DIAGNOSIS — O209 Hemorrhage in early pregnancy, unspecified: Secondary | ICD-10-CM | POA: Diagnosis present

## 2024-01-17 DIAGNOSIS — Z3A01 Less than 8 weeks gestation of pregnancy: Secondary | ICD-10-CM | POA: Diagnosis not present

## 2024-01-17 HISTORY — DX: Complete or unspecified spontaneous abortion without complication: O03.9

## 2024-01-17 LAB — URINALYSIS, ROUTINE W REFLEX MICROSCOPIC
Bilirubin Urine: NEGATIVE
Glucose, UA: NEGATIVE mg/dL
Ketones, ur: 15 mg/dL — AB
Leukocytes,Ua: NEGATIVE
Nitrite: NEGATIVE
Protein, ur: NEGATIVE mg/dL
Specific Gravity, Urine: 1.025 (ref 1.005–1.030)
pH: 6.5 (ref 5.0–8.0)

## 2024-01-17 LAB — CBC WITH DIFFERENTIAL/PLATELET
Abs Immature Granulocytes: 0.05 K/uL (ref 0.00–0.07)
Basophils Absolute: 0 K/uL (ref 0.0–0.1)
Basophils Relative: 0 %
Eosinophils Absolute: 0.1 K/uL (ref 0.0–0.5)
Eosinophils Relative: 2 %
HCT: 34.7 % — ABNORMAL LOW (ref 36.0–46.0)
Hemoglobin: 12.3 g/dL (ref 12.0–15.0)
Immature Granulocytes: 1 %
Lymphocytes Relative: 33 %
Lymphs Abs: 2.1 K/uL (ref 0.7–4.0)
MCH: 31.9 pg (ref 26.0–34.0)
MCHC: 35.4 g/dL (ref 30.0–36.0)
MCV: 89.9 fL (ref 80.0–100.0)
Monocytes Absolute: 0.6 K/uL (ref 0.1–1.0)
Monocytes Relative: 9 %
Neutro Abs: 3.7 K/uL (ref 1.7–7.7)
Neutrophils Relative %: 55 %
Platelets: 160 K/uL (ref 150–400)
RBC: 3.86 MIL/uL — ABNORMAL LOW (ref 3.87–5.11)
RDW: 11.8 % (ref 11.5–15.5)
WBC: 6.6 K/uL (ref 4.0–10.5)
nRBC: 0 % (ref 0.0–0.2)

## 2024-01-17 LAB — URINALYSIS, MICROSCOPIC (REFLEX): Squamous Epithelial / HPF: NONE SEEN /HPF (ref 0–5)

## 2024-01-17 LAB — HCG, SERUM, QUALITATIVE: Preg, Serum: POSITIVE — AB

## 2024-01-17 LAB — HCG, QUANTITATIVE, PREGNANCY: hCG, Beta Chain, Quant, S: 620 m[IU]/mL — ABNORMAL HIGH

## 2024-01-17 MED ORDER — SODIUM CHLORIDE 0.9 % IV BOLUS
1000.0000 mL | Freq: Once | INTRAVENOUS | Status: AC
  Administered 2024-01-17: 1000 mL via INTRAVENOUS

## 2024-01-17 MED ORDER — MISOPROSTOL 200 MCG PO TABS
800.0000 ug | ORAL_TABLET | Freq: Once | ORAL | Status: AC
  Administered 2024-01-17: 800 ug via VAGINAL
  Filled 2024-01-17: qty 4

## 2024-01-17 MED ORDER — MORPHINE SULFATE (PF) 4 MG/ML IV SOLN
4.0000 mg | Freq: Once | INTRAVENOUS | Status: AC
  Administered 2024-01-17: 4 mg via INTRAVENOUS
  Filled 2024-01-17: qty 1

## 2024-01-17 NOTE — ED Triage Notes (Signed)
 Pt states that she is not sure if she pregnant or not. States that last night she started having some cramping and that she began passing large blood clots. States that these symptoms are similar to when she had a miscarriage.

## 2024-01-17 NOTE — ED Provider Notes (Signed)
 " [facility] Provider Note   CSN: [id number] Arrival date & time: 01/17/24  1359     Patient presents with: Vaginal Bleeding   Mariah Sosa is a 28 y.o. female.  Who presents with a 2-day history of increasing pelvic pain as well as increased vaginal bleeding.  Believes she may be miscarrying, as the cycle is about 1 week early compared to previous, does have previous history of miscarriage with obstetric history G3 P1-0-0-2.  The pain is described as cramping type pain just above the pubis, does not lateralize, no exacerbating factors.  Also feels generally weak, dizzy.    Vaginal Bleeding      Prior to Admission medications  Medication Sig Start Date End Date Taking? Authorizing Provider  ibuprofen  (ADVIL ) 600 MG tablet Take 1 tablet (600 mg total) by mouth every 6 (six) hours. 05/16/22   Ogunbekun, Eboni D, DO  NIFEdipine  (ADALAT  CC) 30 MG 24 hr tablet Take 1 tablet (30 mg total) by mouth daily. 05/17/22 08/15/22  Ogunbekun, Eboni D, DO  Prenatal Vit-Fe Fumarate-FA (MULTIVITAMIN-PRENATAL) 27-0.8 MG TABS tablet Take 1 tablet by mouth daily at 12 noon.    [provider]    Allergies: Patient has no known allergies.    Review of Systems  Genitourinary:  Positive for pelvic pain and vaginal bleeding.  All other systems reviewed and are negative.   Updated Vital Signs BP 137/87   Pulse 74   Temp 98 F (36.7 C) (Oral)   Resp 18   Ht 5' 1 (1.549 m)   Wt 54.4 kg   LMP 11/20/2023 (Approximate)   SpO2 100%   BMI 22.67 kg/m   Physical Exam Vitals and nursing note reviewed. Exam conducted with a chaperone present.  Constitutional:      General: She is not in acute distress.    Appearance: Normal appearance.  HENT:     Head: Normocephalic and atraumatic.     Mouth/Throat:     Mouth: Mucous membranes are moist.     Pharynx: Oropharynx is clear.  Eyes:     Extraocular Movements: Extraocular movements intact.      Conjunctiva/sclera: Conjunctivae normal.     Pupils: Pupils are equal, round, and reactive to light.  Cardiovascular:     Rate and Rhythm: Normal rate and regular rhythm.     Pulses: Normal pulses.     Heart sounds: Normal heart sounds. No murmur heard.    No friction rub. No gallop.  Pulmonary:     Effort: Pulmonary effort is normal.     Breath sounds: Normal breath sounds.  Abdominal:     General: Abdomen is flat. Bowel sounds are normal.     Palpations: Abdomen is soft.     Hernia: There is no hernia in the left inguinal area or right inguinal area.  Genitourinary:    Exam position: Lithotomy position.     Vagina: Normal.     Cervix: Cervical bleeding present.     Comments: Unable to adequately visualize cervix secondary to bleeding.  Bimanual exam deferred secondary to bleeding and pain. Musculoskeletal:        General: Normal range of motion.     Cervical back: Normal range of motion and neck supple.     Right lower leg: No edema.     Left lower leg: No edema.  Lymphadenopathy:     Lower Body: No right inguinal adenopathy. No left inguinal adenopathy.  Skin:  General: Skin is warm and dry.     Capillary Refill: Capillary refill takes less than 2 seconds.  Neurological:     General: No focal deficit present.     Mental Status: She is alert. Mental status is at baseline.  Psychiatric:        Mood and Affect: Mood normal.     (all labs ordered are listed, but only abnormal results are displayed) Labs Reviewed  CBC WITH DIFFERENTIAL/PLATELET - Abnormal; Notable for the following components:      Result Value   RBC 3.86 (*)    HCT 34.7 (*)    All other components within normal limits  URINALYSIS, ROUTINE W REFLEX MICROSCOPIC - Abnormal; Notable for the following components:   Hgb urine dipstick LARGE (*)    Ketones, ur 15 (*)    All other components within normal limits  HCG, SERUM, QUALITATIVE - Abnormal; Notable for the following components:   Preg, Serum POSITIVE  (*)    All other components within normal limits  HCG, QUANTITATIVE, PREGNANCY - Abnormal; Notable for the following components:   hCG, Beta Chain, Quant, S 620 (*)    All other components within normal limits  URINALYSIS, MICROSCOPIC (REFLEX) - Abnormal; Notable for the following components:   Bacteria, UA RARE (*)    All other components within normal limits    EKG: None  Radiology: US  OB LESS THAN 14 WEEKS WITH OB TRANSVAGINAL Result Date: 01/17/2024 EXAM: ULTRASOUND FIRST TRIMESTER TECHNIQUE: Transabdominal and Transvaginal first trimester obstetric pelvic duplex ultrasound was performed with real-time imaging, color flow Doppler imaging, and spectral analysis. COMPARISON: 05/14/2021 CLINICAL HISTORY: Vaginal bleeding. FINDINGS: UTERUS: Elongated gestational sac in the lower uterine segment/cervix, measuring 3 x 1.1 x 2.2 cm (mean sac diameter of 2.1 cm). Heterogeneous material within the cervix on the transabdominal images, resolved on the transvaginal imaging after the patient went to the bathroom, possibly representing blood products. No focal myometrial mass. GESTATIONAL SAC(S): Elongated gestational sac in the lower uterine segment/cervix, measuring 3 x 1.1 x 2.2 cm (mean sac diameter of 2.1 cm). No subchorionic hemorrhage. YOLK SAC: No yolk sac is visualized. EMBRYO(<11WK) /FETUS(>=11WK): Single fetal pole measuring 13 mm. CROWN RUMP LENGTH: 13 mm. RATE OF CARDIAC ACTIVITY: No fetal heart beat visualized during the exam. RIGHT OVARY: Hypoechogenic lesion in the right ovary measuring 1 x 0.6 x 0.7 cm, likely the corpus luteum. LEFT OVARY: Unremarkable. FREE FLUID: No free fluid. MEASUREMENTS ESTIMATED GESTATIONAL AGE BY CURRENT ULTRASOUND: 7 weeks, 4 days. ESTIMATED GESTATIONAL AGE BY LMP/PRIOR ULTRASOUND: Not provided. ESTIMATED DUE DATE: Not provided. IMPRESSION: 1. Single, elongated gestational sac in the lower uterine segment/cervix, with a 13 mm fetal pole present, and an estimated  gestational age of [redacted] weeks and 4 days. No fetal heartbeat present during the exam. In the setting of vaginal bleeding, this represents a failed first trimester pregnancy and a miscarriage in progress. Electronically signed by: Rogelia Myers MD 01/17/2024 05:27 PM EST RP Workstation: [id number]     .Pelvic exam  Date/Time: 01/17/2024 3:31 PM  Performed by: Myriam Dorn BROCKS, PA Authorized by: Myriam Dorn BROCKS, PA  Consent: Verbal consent obtained Risks and benefits: risks, benefits and alternatives were discussed Consent given by: patient Patient understanding: patient states understanding of the procedure being performed Patient consent: the patient's understanding of the procedure matches consent given Procedure consent: procedure consent matches procedure scheduled Patient identity confirmed: verbally with patient, arm band and provided demographic data Local anesthesia used: no  Anesthesia: Local  anesthesia used: no  Sedation: Patient sedated: no  Patient tolerance: patient tolerated the procedure well with no immediate complications      Medications Ordered in the ED  sodium chloride  0.9 % bolus 1,000 mL (0 mLs Intravenous Stopped 01/17/24 1926)  morphine  (PF) 4 MG/ML injection 4 mg (4 mg Intravenous Given 01/17/24 1539)  misoprostol  (CYTOTEC ) tablet 800 mcg (800 mcg Vaginal Given 01/17/24 1810)                                    Medical Decision Making Amount and/or Complexity of Data Reviewed Labs: ordered. Radiology: ordered.  Risk Prescription drug management.   Medical Decision Making:   ALACIA REHMANN is a 28 y.o. female who presented to the ED today with vaginal bleeding and pelvic pain detailed above.     Complete initial physical exam performed, notably the patient  was alert and oriented no apparent distress.  She does have significant pelvic discomfort and notable vaginal bleeding..    Reviewed and confirmed nursing documentation for past  medical history, family history, social history.    Initial Assessment:   With the patient's presentation of abnormal vaginal bleeding and pelvic pain, consider abnormal uterine bleeding, leiomyoma, ectopic pregnancy, spontaneous abortion.   Initial Plan:  Initial hCG qualitative with reflex to hCG quant if positive. Screening labs including CBC and Metabolic panel to evaluate for infectious or metabolic etiology of disease.  Urinalysis with reflex culture ordered to evaluate for UTI or relevant urologic/nephrologic pathology.  Ultrasound imaging of the pelvis to evaluate for obstetric etiology. IV morphine  for pain control 1 L bolus of IV fluids for for patient's reported dizziness as well as blood loss. Objective evaluation as below reviewed   Initial Study Results:   Laboratory  All laboratory results reviewed without evidence of clinically relevant pathology.   Exceptions include: hCG quant is 620.   Radiology:  All images reviewed independently. Agree with radiology report at this time.   US  OB LESS THAN 14 WEEKS WITH OB TRANSVAGINAL Result Date: 01/17/2024 EXAM: ULTRASOUND FIRST TRIMESTER TECHNIQUE: Transabdominal and Transvaginal first trimester obstetric pelvic duplex ultrasound was performed with real-time imaging, color flow Doppler imaging, and spectral analysis. COMPARISON: 05/14/2021 CLINICAL HISTORY: Vaginal bleeding. FINDINGS: UTERUS: Elongated gestational sac in the lower uterine segment/cervix, measuring 3 x 1.1 x 2.2 cm (mean sac diameter of 2.1 cm). Heterogeneous material within the cervix on the transabdominal images, resolved on the transvaginal imaging after the patient went to the bathroom, possibly representing blood products. No focal myometrial mass. GESTATIONAL SAC(S): Elongated gestational sac in the lower uterine segment/cervix, measuring 3 x 1.1 x 2.2 cm (mean sac diameter of 2.1 cm). No subchorionic hemorrhage. YOLK SAC: No yolk sac is visualized. EMBRYO(<11WK)  /FETUS(>=11WK): Single fetal pole measuring 13 mm. CROWN RUMP LENGTH: 13 mm. RATE OF CARDIAC ACTIVITY: No fetal heart beat visualized during the exam. RIGHT OVARY: Hypoechogenic lesion in the right ovary measuring 1 x 0.6 x 0.7 cm, likely the corpus luteum. LEFT OVARY: Unremarkable. FREE FLUID: No free fluid. MEASUREMENTS ESTIMATED GESTATIONAL AGE BY CURRENT ULTRASOUND: 7 weeks, 4 days. ESTIMATED GESTATIONAL AGE BY LMP/PRIOR ULTRASOUND: Not provided. ESTIMATED DUE DATE: Not provided. IMPRESSION: 1. Single, elongated gestational sac in the lower uterine segment/cervix, with a 13 mm fetal pole present, and an estimated gestational age of [redacted] weeks and 4 days. No fetal heartbeat present during the exam. In the setting of vaginal  bleeding, this represents a failed first trimester pregnancy and a miscarriage in progress. Electronically signed by: Rogelia Myers MD 01/17/2024 05:27 PM EST RP Workstation: [id number]      Consults: Case discussed with Dr. Armond with OB/GYN.   Reassessment and Plan:   Given the findings of retained products of conception as well as physical exam findings consistent with imminent abortion, discussed with Dr. Armond with OB/GYN who recommends intravaginal misoprostol  and follow-up to their office within the next 2 weeks.  This plan was discussed thoroughly with the patient and she verbalizes understanding and agreement with this care plan.  Plan at this time is to discharge patient with outpatient follow-up, understands to return if bleeding worsens, and has increasing pain despite current treatment, or has any other concerning signs and symptoms prior to assessment by OB/GYN.       Final diagnoses:  Spontaneous abortion in first trimester    ED Discharge Orders     None          Myriam Dorn BROCKS, [place] 01/17/24 2213    Patt Alm Macho, MD 01/17/24 [phone]  "

## 2024-01-17 NOTE — ED Notes (Signed)
 Patient transported to [place]
